# Patient Record
Sex: Female | Born: 1990 | Hispanic: No | Marital: Single | Smoking: Never smoker
Health system: Southern US, Community
[De-identification: ages and names within clinical notes are randomized; demographics above are authoritative.]

## PROBLEM LIST (undated history)

## (undated) DIAGNOSIS — F79 Unspecified intellectual disabilities: Secondary | ICD-10-CM

## (undated) DIAGNOSIS — I10 Essential (primary) hypertension: Secondary | ICD-10-CM

## (undated) DIAGNOSIS — F952 Tourette's disorder: Secondary | ICD-10-CM

## (undated) DIAGNOSIS — F909 Attention-deficit hyperactivity disorder, unspecified type: Secondary | ICD-10-CM

## (undated) HISTORY — DX: Unspecified intellectual disabilities: F79

## (undated) HISTORY — DX: Attention-deficit hyperactivity disorder, unspecified type: F90.9

## (undated) HISTORY — PX: TONSILLECTOMY AND ADENOIDECTOMY: SUR1326

## (undated) HISTORY — DX: Essential (primary) hypertension: I10

## (undated) HISTORY — DX: Tourette's disorder: F95.2

---
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
## 2011-03-27 DIAGNOSIS — F79 Unspecified intellectual disabilities: Secondary | ICD-10-CM | POA: Insufficient documentation

## 2011-03-27 DIAGNOSIS — F902 Attention-deficit hyperactivity disorder, combined type: Secondary | ICD-10-CM | POA: Insufficient documentation

## 2011-03-27 DIAGNOSIS — G9349 Other encephalopathy: Secondary | ICD-10-CM | POA: Insufficient documentation

## 2011-03-27 DIAGNOSIS — F429 Obsessive-compulsive disorder, unspecified: Secondary | ICD-10-CM | POA: Insufficient documentation

## 2011-03-27 DIAGNOSIS — F952 Tourette's disorder: Secondary | ICD-10-CM | POA: Insufficient documentation

## 2014-04-27 ENCOUNTER — Telehealth: Payer: Self-pay | Admitting: Family Medicine

## 2014-04-27 NOTE — Telephone Encounter (Signed)
APPT MADE TO RE ESTABLISH CARE

## 2014-04-29 ENCOUNTER — Ambulatory Visit (INDEPENDENT_AMBULATORY_CARE_PROVIDER_SITE_OTHER): Payer: Medicaid Other | Admitting: Family Medicine

## 2014-04-29 ENCOUNTER — Encounter: Payer: Self-pay | Admitting: Family Medicine

## 2014-04-29 ENCOUNTER — Ambulatory Visit: Payer: Self-pay | Admitting: Family Medicine

## 2014-04-29 ENCOUNTER — Encounter (INDEPENDENT_AMBULATORY_CARE_PROVIDER_SITE_OTHER): Payer: Self-pay

## 2014-04-29 ENCOUNTER — Ambulatory Visit: Payer: Medicaid Other | Admitting: *Deleted

## 2014-04-29 VITALS — BP 130/90 | HR 98 | Temp 99.4°F | Ht 66.0 in | Wt 153.0 lb

## 2014-04-29 DIAGNOSIS — F902 Attention-deficit hyperactivity disorder, combined type: Secondary | ICD-10-CM

## 2014-04-29 DIAGNOSIS — F909 Attention-deficit hyperactivity disorder, unspecified type: Secondary | ICD-10-CM

## 2014-04-29 DIAGNOSIS — F952 Tourette's disorder: Secondary | ICD-10-CM

## 2014-04-29 DIAGNOSIS — F79 Unspecified intellectual disabilities: Secondary | ICD-10-CM

## 2014-04-29 DIAGNOSIS — G9349 Other encephalopathy: Secondary | ICD-10-CM

## 2014-04-29 DIAGNOSIS — Z23 Encounter for immunization: Secondary | ICD-10-CM

## 2014-04-29 DIAGNOSIS — F429 Obsessive-compulsive disorder, unspecified: Secondary | ICD-10-CM

## 2014-04-29 DIAGNOSIS — F42 Obsessive-compulsive disorder: Secondary | ICD-10-CM

## 2014-04-29 DIAGNOSIS — I10 Essential (primary) hypertension: Secondary | ICD-10-CM

## 2014-04-29 LAB — POCT CBC
Granulocyte percent: 52.9 %G (ref 37–80)
HCT, POC: 36.8 % — AB (ref 37.7–47.9)
Hemoglobin: 12.5 g/dL (ref 12.2–16.2)
Lymph, poc: 2.6 (ref 0.6–3.4)
MCH, POC: 30.1 pg (ref 27–31.2)
MCHC: 34 g/dL (ref 31.8–35.4)
MCV: 88.5 fL (ref 80–97)
MPV: 8.6 fL (ref 0–99.8)
POC Granulocyte: 3.1 (ref 2–6.9)
POC LYMPH PERCENT: 44.2 %L (ref 10–50)
Platelet Count, POC: 265 10*3/uL (ref 142–424)
RBC: 4.2 M/uL (ref 4.04–5.48)
RDW, POC: 12.2 %
WBC: 5.8 10*3/uL (ref 4.6–10.2)

## 2014-04-29 MED ORDER — LISINOPRIL 10 MG PO TABS
10.0000 mg | ORAL_TABLET | Freq: Every day | ORAL | Status: DC
Start: 2014-04-29 — End: 2014-10-05

## 2014-04-29 NOTE — Patient Instructions (Signed)
Continue to watch your weight and work on your diet Take your blood pressure pill regularly Watch your sodium intake Drink plenty of water Use earwax softener in both ear canals for 2 or 3 nights in a row weight 1 week and repeat this procedure We will call you with the lab work results once those results are available You will need a BMP in 2-3 weeks and bring home blood pressure readings all returned when the blood work is done

## 2014-04-29 NOTE — Progress Notes (Signed)
Subjective:    Patient ID: Tiffany Bates, female    DOB: 07-09-1990, 23 y.o.   MRN: [id number]  HPI Patient here today to establish care for health maintenance. She is accompanied today by her father and stepmother.The patient is followed by a specialist for her Tourette's syndrome, she will have her come to [place] for blood pressure control and other medical issues that may develop. Her father has had a heart attack. We will most likely want to get some lab work on her and get her started on a blood pressure medication. Readings were brought in for review today from October and early November. These will be scanned into the record.the patient is also on birth control and she has a GYN appointment set up for the first of 2016. Today she is complaining of right ear pain and a   buzzing sensation.         Patient Active Problem List   Diagnosis Date Noted  . Encephalopathy chronic 04/29/2014  . Intellectual disability 04/29/2014  . Tourette syndrome 04/29/2014  . ADHD (attention deficit hyperactivity disorder) 04/29/2014  . Obsessive compulsive disorder 04/29/2014  . HTN (hypertension) 04/29/2014   Outpatient Encounter Prescriptions as of 04/29/2014  Medication Sig  . atomoxetine (STRATTERA) 80 MG capsule Take 80 mg by mouth daily.  . norethindrone-ethinyl estradiol 1/35 (ORTHO-NOVUM, NORTREL,CYCLAFEM) tablet Take 1 tablet by mouth daily.  . Oxcarbazepine (TRILEPTAL) 300 MG tablet Take 900 mg by mouth 2 (two) times daily.  Marland Kitchen PARoxetine (PAXIL) 40 MG tablet Take 40 mg by mouth every morning.    Review of Systems  Constitutional: Negative.   HENT: Negative.   Eyes: Negative.   Respiratory: Negative.   Cardiovascular: Negative.        Elevated BP  Gastrointestinal: Negative.   Endocrine: Negative.   Genitourinary: Negative.   Musculoskeletal: Negative.   Skin: Negative.   Allergic/Immunologic: Negative.   Neurological: Negative.   Hematological: Negative.     Psychiatric/Behavioral: Negative.        Objective:   Physical Exam  Constitutional: She is oriented to person, place, and time. She appears well-developed and well-nourished.  Minimally stressed  HENT:  Head: Normocephalic.  Mouth/Throat: Oropharynx is clear and moist. No oropharyngeal exudate.  Bilateral ear cerumen and nasal congestion  Eyes: Conjunctivae and EOM are normal. Pupils are equal, round, and reactive to light. Right eye exhibits no discharge. Left eye exhibits no discharge. No scleral icterus.  Neck: Normal range of motion. Neck supple. No thyromegaly present.  No anterior cervical adenopathy  Cardiovascular: Normal rate, regular rhythm, normal heart sounds and intact distal pulses.   No murmur heard. Pulmonary/Chest: Effort normal and breath sounds normal. No respiratory distress. She has no wheezes. She has no rales. She exhibits no tenderness.  Abdominal: Soft. Bowel sounds are normal. She exhibits no mass. There is tenderness. There is no rebound and no guarding.  Minimal tenderness left upper quadrant  Musculoskeletal: Normal range of motion. She exhibits no edema.  Lymphadenopathy:    She has no cervical adenopathy.  Neurological: She is alert and oriented to person, place, and time.  Skin: Skin is warm and dry. No rash noted.  Psychiatric: She has a normal mood and affect. Her behavior is normal. Thought content normal.  Nursing note and vitals reviewed.  BP 130/90 mmHg  Pulse 98  Temp(Src) 99.4 F (37.4 C) (Oral)  Ht _0  (1.676 m)  Wt 153 lb (69.4 kg)  BMI 24.71 kg/m2  Repeat blood pressure  132/98 right arm sitting      Assessment & Plan:  1. Encephalopathy chronic - POCT CBC  2. Intellectual disability - POCT CBC  3. Tourette syndrome - POCT CBC  4. Attention deficit hyperactivity disorder (ADHD), combined type - POCT CBC  5. Obsessive compulsive disorder - POCT CBC  6. Essential hypertension - POCT CBC - BMP8+EGFR - Hepatic  function panel - Lipid panel - Thyroid Panel With TSH - lisinopril (PRINIVIL,ZESTRIL) 10 MG tablet; Take 1 tablet (10 mg total) by mouth daily.  Dispense: 90 tablet; Refill: 3  Patient Instructions  Continue to watch your weight and work on your diet Take your blood pressure pill regularly Watch your sodium intake Drink plenty of water Use earwax softener in both ear canals for 2 or 3 nights in a row weight 1 week and repeat this procedure We will call you with the lab work results once those results are available You will need a BMP in 2-3 weeks and bring home blood pressure readings all returned when the blood work is done   Arrie Senate MD

## 2014-04-30 LAB — LIPID PANEL
CHOLESTEROL TOTAL: 202 mg/dL — AB (ref 100–199)
Chol/HDL Ratio: 3.7 ratio units (ref 0.0–4.4)
HDL: 54 mg/dL (ref >39)
LDL Calculated: 129 mg/dL — ABNORMAL HIGH (ref 0–99)
Triglycerides: 93 mg/dL (ref 0–149)
VLDL Cholesterol Cal: 19 mg/dL (ref 5–40)

## 2014-04-30 LAB — BMP8+EGFR
BUN / CREAT RATIO: 20 (ref 8–20)
BUN: 14 mg/dL (ref 6–20)
CO2: 25 mmol/L (ref 18–29)
CREATININE: 0.71 mg/dL (ref 0.57–1.00)
Calcium: 9.6 mg/dL (ref 8.7–10.2)
Chloride: 103 mmol/L (ref 97–108)
GFR calc Af Amer: 139 mL/min/{1.73_m2} (ref >59)
GFR calc non Af Amer: 120 mL/min/{1.73_m2} (ref >59)
Glucose: 92 mg/dL (ref 65–99)
POTASSIUM: 4.5 mmol/L (ref 3.5–5.2)
SODIUM: 143 mmol/L (ref 134–144)

## 2014-04-30 LAB — HEPATIC FUNCTION PANEL
ALT: 15 IU/L (ref 0–32)
AST: 14 IU/L (ref 0–40)
Albumin: 4.3 g/dL (ref 3.5–5.5)
Alkaline Phosphatase: 58 IU/L (ref 39–117)
Bilirubin, Direct: 0.07 mg/dL (ref 0.00–0.40)
Total Protein: 6.6 g/dL (ref 6.0–8.5)

## 2014-04-30 LAB — THYROID PANEL WITH TSH
FREE THYROXINE INDEX: 1.2 (ref 1.2–4.9)
T3 Uptake Ratio: 20 % — ABNORMAL LOW (ref 24–39)
T4 TOTAL: 5.8 ug/dL (ref 4.5–12.0)
TSH: 1.81 u[IU]/mL (ref 0.450–4.500)

## 2014-05-01 ENCOUNTER — Telehealth: Payer: Self-pay | Admitting: Family Medicine

## 2014-05-01 NOTE — Telephone Encounter (Signed)
-----   Message from Donald W Moore, MD sent at 04/30/2014  1:50 PM EST ----- The blood sugar is good at 92. The creatinine, the most important kidney function test is within normal limits. The electrolytes including potassium are within normal limits. All liver function tests are within normal limits The total cholesterol was slightly elevated at 202. The LDL C is elevated at 129. The triglycerides are good.------ please schedule this patient with her mother or father with the clinical pharmacists to discuss therapeutic lifestyle changes which would be helpful in lowering her cholesterol----the parents are out of town for one week. All thyroid function tests are within normal limits 

## 2014-05-07 ENCOUNTER — Telehealth: Payer: Self-pay | Admitting: Family Medicine

## 2014-05-07 NOTE — Telephone Encounter (Signed)
Spoke to pt's mother, she states Tiffany Bates wasn't fasting when she had the blood work drawn, they follow a strict heart healthy low cholesterol diet already and at her visit you had told them to come back in to recheck her labs in 4-6 weeks, she states they will schedule an appointment with Tammy after the results of the follow up labs come back. She doesn't see the point in setting an appointment up with her at this time. Reviewed all other labs with pt's mother, and she voiced understanding. Would you like me to do anything else with this?

## 2014-05-07 NOTE — Telephone Encounter (Signed)
-----   Message from Ernestina Pennaonald W Moore, MD sent at 04/30/2014  1:50 PM EST ----- The blood sugar is good at 92. The creatinine, the most important kidney function test is within normal limits. The electrolytes including potassium are within normal limits. All liver function tests are within normal limits The total cholesterol was slightly elevated at 202. The LDL C is elevated at 129. The triglycerides are good.------ please schedule this patient with her mother or father with the clinical pharmacists to discuss therapeutic lifestyle changes which would be helpful in lowering her cholesterol----the parents are out of town for one week. All thyroid function tests are within normal limits

## 2014-08-03 ENCOUNTER — Other Ambulatory Visit: Payer: Self-pay | Admitting: Family Medicine

## 2014-10-02 ENCOUNTER — Encounter: Payer: Self-pay | Admitting: Family Medicine

## 2014-10-02 ENCOUNTER — Ambulatory Visit (INDEPENDENT_AMBULATORY_CARE_PROVIDER_SITE_OTHER): Payer: Medicaid Other | Admitting: Family Medicine

## 2014-10-02 VITALS — BP 125/84 | HR 93 | Temp 98.4°F | Ht 66.0 in | Wt 151.0 lb

## 2014-10-02 DIAGNOSIS — I10 Essential (primary) hypertension: Secondary | ICD-10-CM

## 2014-10-02 DIAGNOSIS — F952 Tourette's disorder: Secondary | ICD-10-CM

## 2014-10-02 NOTE — Patient Instructions (Addendum)
Continue to monitor blood pressures at home  Continue to watch sodium intake Stay active physically Continue current medication Maintain good posture

## 2014-10-02 NOTE — Progress Notes (Signed)
Subjective:    Patient ID: Tiffany Bates, female    DOB: 04/06/1991, 25 y.o.   MRN: [id number]  HPI  24 year old female comes in today to follow up on hypertension. She is accompanied by her father. She brings in readings with her for review. She is taking her medications regularly. The blood pressure readings are running in the 110 range up to the 120 range over the 70-80 range on the majority of outside blood pressure readings. These will be scanned into the record. The patient is active and she has Tourette syndrome. She walks a lot and exercises. She did describes no chest pain no shortness of breath passing her water okay and her bowels are moving regularly.  Patient Active Problem List   Diagnosis Date Noted  . Encephalopathy chronic 04/29/2014  . Intellectual disability 04/29/2014  . Tourette syndrome 04/29/2014  . ADHD (attention deficit hyperactivity disorder) 04/29/2014  . Obsessive compulsive disorder 04/29/2014  . HTN (hypertension) 04/29/2014   Outpatient Encounter Prescriptions as of 10/02/2014  Medication Sig  . atomoxetine (STRATTERA) 80 MG capsule Take 80 mg by mouth daily.  Marland Kitchen lisinopril (PRINIVIL,ZESTRIL) 10 MG tablet Take 1 tablet (10 mg total) by mouth daily.  . norethindrone-ethinyl estradiol 1/35 (ORTHO-NOVUM, NORTREL,CYCLAFEM) tablet Take 1 tablet by mouth daily.  . Oxcarbazepine (TRILEPTAL) 300 MG tablet Take 900 mg by mouth 2 (two) times daily.  Marland Kitchen PARoxetine (PAXIL) 40 MG tablet Take 40 mg by mouth every morning.       Review of Systems  Constitutional: Negative.   HENT: Negative.   Eyes: Negative.   Respiratory: Negative.   Cardiovascular: Negative.   Gastrointestinal: Negative.   Endocrine: Negative.   Genitourinary: Negative.   Musculoskeletal: Negative.   Skin: Negative.   Allergic/Immunologic: Negative.   Neurological: Negative.   Hematological: Negative.   Psychiatric/Behavioral: Negative.        Objective:   Physical Exam    Constitutional: She is oriented to person, place, and time. She appears well-developed and well-nourished. She appears distressed.  HENT:  Head: Normocephalic and atraumatic.  Eyes: Conjunctivae and EOM are normal. Pupils are equal, round, and reactive to light. Right eye exhibits no discharge. Left eye exhibits no discharge. No scleral icterus.  Neck: Normal range of motion. Neck supple. No thyromegaly present.  Cardiovascular: Normal rate, regular rhythm and intact distal pulses.  Exam reveals no friction rub.   No murmur heard. Pulmonary/Chest: Effort normal and breath sounds normal. No respiratory distress. She has no wheezes. She has no rales. She exhibits no tenderness.  Abdominal: Soft. Bowel sounds are normal. She exhibits no mass. There is no tenderness. There is no rebound and no guarding.  Slight right lower quadrant tenderness  Musculoskeletal: Normal range of motion. She exhibits no edema.  Lymphadenopathy:    She has no cervical adenopathy.  Neurological: She is alert and oriented to person, place, and time.  Skin: Skin is warm and dry. No rash noted.  Psychiatric: She has a normal mood and affect. Her behavior is normal. Judgment and thought content normal.  Somewhat anxious and nervous  Nursing note and vitals reviewed.    BP 125/84 mmHg  Pulse 93  Temp(Src) 98.4 F (36.9 C) (Oral)  Ht _0  (1.676 m)  Wt 151 lb (68.493 kg)  BMI 24.38 kg/m2      Assessment & Plan:  1. Essential hypertension - BMP8+EGFR -Stay active, watch sodium intake  2. Tourette syndrome   Patient Instructions  Continue to monitor  blood pressures at home  Continue to watch sodium intake Stay active physically Continue current medication    Maintain good posture  Arrie Senate MD

## 2014-10-03 LAB — BMP8+EGFR
BUN / CREAT RATIO: 28 — AB (ref 8–20)
BUN: 19 mg/dL (ref 6–20)
CO2: 23 mmol/L (ref 18–29)
Calcium: 9.4 mg/dL (ref 8.7–10.2)
Chloride: 98 mmol/L (ref 97–108)
Creatinine, Ser: 0.67 mg/dL (ref 0.57–1.00)
GFR calc Af Amer: 142 mL/min/{1.73_m2} (ref >59)
GFR calc non Af Amer: 123 mL/min/{1.73_m2} (ref >59)
Glucose: 89 mg/dL (ref 65–99)
Potassium: 4.6 mmol/L (ref 3.5–5.2)
Sodium: 136 mmol/L (ref 134–144)

## 2014-10-05 MED ORDER — LISINOPRIL 10 MG PO TABS: 10.0000 mg | ORAL_TABLET | Freq: Every day | ORAL | Status: DC

## 2014-10-05 NOTE — Addendum Note (Signed)
Addended by: Tamera PuntWRAY, Cleora Karnik S on: 10/05/2014 09:40 AM   Modules accepted: Orders

## 2015-10-11 ENCOUNTER — Other Ambulatory Visit: Payer: Self-pay | Admitting: Family Medicine

## 2015-10-12 ENCOUNTER — Other Ambulatory Visit: Payer: Self-pay | Admitting: Family Medicine

## 2015-10-12 NOTE — Telephone Encounter (Signed)
Last seen 10/02/2014

## 2015-10-12 NOTE — Telephone Encounter (Signed)
This patient needs an appointment to be seen.

## 2015-11-09 ENCOUNTER — Ambulatory Visit: Payer: Medicaid Other | Admitting: Family Medicine

## 2016-01-19 ENCOUNTER — Encounter: Payer: Self-pay | Admitting: Family Medicine

## 2016-01-19 ENCOUNTER — Ambulatory Visit (INDEPENDENT_AMBULATORY_CARE_PROVIDER_SITE_OTHER): Payer: Medicaid Other | Admitting: Family Medicine

## 2016-01-19 VITALS — BP 114/77 | HR 99 | Temp 97.1°F | Ht 66.0 in

## 2016-01-19 DIAGNOSIS — F79 Unspecified intellectual disabilities: Secondary | ICD-10-CM

## 2016-01-19 DIAGNOSIS — R6884 Jaw pain: Secondary | ICD-10-CM | POA: Diagnosis not present

## 2016-01-19 DIAGNOSIS — F429 Obsessive-compulsive disorder, unspecified: Secondary | ICD-10-CM

## 2016-01-19 DIAGNOSIS — G9349 Other encephalopathy: Secondary | ICD-10-CM

## 2016-01-19 DIAGNOSIS — F902 Attention-deficit hyperactivity disorder, combined type: Secondary | ICD-10-CM

## 2016-01-19 DIAGNOSIS — F952 Tourette's disorder: Secondary | ICD-10-CM

## 2016-01-19 DIAGNOSIS — I10 Essential (primary) hypertension: Secondary | ICD-10-CM

## 2016-01-19 MED ORDER — LISINOPRIL 10 MG PO TABS: ORAL_TABLET | ORAL | 1 refills | Status: DC

## 2016-01-19 NOTE — Progress Notes (Signed)
Subjective:    Patient ID: Tiffany Bates, female    DOB: 1991/01/18, 25 y.o.   MRN: [id number]  HPI Pt here for follow up and management of chronic medical problems which includes hypertension. She is accompanied today by her father and a caregiver. She is taking medications regularly.The patient is doing well overall. She does complain of some right jaw pain when eating at times. She is requesting a refill on the lisinopril. We will discuss with her the importance of getting a pelvic exam especially since she is taking birth control pills. The patient denies any chest pain or shortness of breath. She does have occasional abdominal pain and a sore in her abdomen. She is not seeing any blood in the stool or had any problems with swallowing heartburn indigestion nausea or vomiting or diarrhea. She is passing her water without problems.     Patient Active Problem List   Diagnosis Date Noted  . Encephalopathy chronic 04/29/2014  . Intellectual disability 04/29/2014  . Tourette syndrome 04/29/2014  . ADHD (attention deficit hyperactivity disorder) 04/29/2014  . Obsessive compulsive disorder 04/29/2014  . HTN (hypertension) 04/29/2014   Outpatient Encounter Prescriptions as of 01/19/2016  Medication Sig  . atomoxetine (STRATTERA) 80 MG capsule Take 80 mg by mouth daily.  Marland Kitchen lisinopril (PRINIVIL,ZESTRIL) 10 MG tablet Take 1 tablet (10 mg total) by mouth daily.  . methylphenidate 54 MG PO CR tablet TAKE (1) TABLET DAILY IN THE MORNING.  Marland Kitchen norethindrone-ethinyl estradiol 1/35 (ORTHO-NOVUM, NORTREL,CYCLAFEM) tablet Take 1 tablet by mouth daily.  . Oxcarbazepine (TRILEPTAL) 300 MG tablet Take 900 mg by mouth 2 (two) times daily.  Marland Kitchen PARoxetine (PAXIL) 40 MG tablet Take 40 mg by mouth every morning.  . [DISCONTINUED] lisinopril (PRINIVIL,ZESTRIL) 10 MG tablet Take 1 tablet (10 mg total) by mouth daily.   No facility-administered encounter medications on file as of 01/19/2016.       Review of  Systems  Constitutional: Negative.   HENT: Negative.        Right side jaw pain when eating  Eyes: Negative.   Respiratory: Negative.   Cardiovascular: Negative.   Gastrointestinal: Negative.   Endocrine: Negative.   Genitourinary: Negative.   Musculoskeletal: Negative.   Skin: Negative.   Allergic/Immunologic: Negative.   Neurological: Negative.   Hematological: Negative.   Psychiatric/Behavioral: Negative.        Objective:   Physical Exam  Constitutional: She is oriented to person, place, and time. She appears well-developed and well-nourished. She appears distressed.  HENT:  Head: Normocephalic and atraumatic.  Right Ear: External ear normal.  Left Ear: External ear normal.  Nose: Nose normal.  Mouth/Throat: Oropharynx is clear and moist.  The patient was tender in the right TMJ area. This seemed to reproduce some of the pain that she was having in that area.  Eyes: Conjunctivae and EOM are normal. Pupils are equal, round, and reactive to light. Right eye exhibits no discharge. Left eye exhibits no discharge. No scleral icterus.  Neck: Normal range of motion. Neck supple. No thyromegaly present.  No anterior cervical adenopathy or thyromegaly  Cardiovascular: Normal rate, regular rhythm, normal heart sounds and intact distal pulses.   No murmur heard. Pulmonary/Chest: Effort normal and breath sounds normal. No respiratory distress. She has no wheezes. She has no rales. She exhibits no tenderness.  Clear anteriorly and posteriorly  Abdominal: Soft. Bowel sounds are normal. She exhibits no distension and no mass. There is tenderness. There is no rebound and no guarding.  The abdomen was generally tender in the left upper quadrant and right upper quadrant and the lower abdomen all areas but non-specific. There are no masses.  Musculoskeletal: Normal range of motion. She exhibits tenderness. She exhibits no edema.  The right TMJ was tender to palpation.  Lymphadenopathy:     She has no cervical adenopathy.  Neurological: She is alert and oriented to person, place, and time. She has normal reflexes. No cranial nerve deficit.  Skin: Skin is warm and dry. No rash noted.  Psychiatric: Her behavior is normal. Judgment and thought content normal.  The patient was somewhat emotional and describing her jaw pain and her stomach pain and some ankle pain.  Nursing note and vitals reviewed.  BP 114/77 (BP Location: Right Arm)   Pulse 99   Temp 97.1 F (36.2 C) (Oral)   Ht  (1.676 m)         Assessment & Plan:  1. Essential hypertension -The blood pressure is good and she will continue with current treatment -The patient will return to clinic for routine lab work tomorrow  2. Tourette syndrome -Continue current treatment  3. Encephalopathy chronic  4. Attention deficit hyperactivity disorder (ADHD), combined type -Continue current treatment  5. Intellectual disability  6. Obsessive compulsive disorder  7. Jaw pain -Use warm wet compresses 20 minutes 3 or 4 times daily and take ibuprofen 200 mg after meals for 7 days to see if the soreness and tenderness in the right TMJ area can be resolved  Meds ordered this encounter  Medications  . methylphenidate 54 MG PO CR tablet    Sig: TAKE (1) TABLET DAILY IN THE MORNING.  Marland Kitchen lisinopril (PRINIVIL,ZESTRIL) 10 MG tablet    Sig: Take 1 tablet (10 mg total) by mouth daily.    Dispense:  90 tablet    Refill:  1   Patient Instructions  Continue current medications. Continue good therapeutic lifestyle changes which include good diet and exercise. Fall precautions discussed with patient. If an FOBT was given today- please return it to our front desk.  After your visit with [place] today you will receive a survey in the mail or online from [employer] regarding your care with [place]. Please take a moment to fill this out. Your feedback is very important to [place] as you can help [place] better understand your patient needs as  well as improve your experience and satisfaction. WE CARE ABOUT YOU!!!   We will arrange for you to have a visit with one of our physicians who can do an internal exam because of your age She will explain everything to you to make it comfortable with this exam For about 1 week take ibuprofen 200 mg 3 times daily after meals and use warm wet compresses to the right temporomandibular joint area. Continue to watch sodium intake Remember that eating a lot of milk cheese ice cream and dairy products can give you a lot of gas and bloating and can make your abdomen hurt more.     Nyra Capes MD

## 2016-01-19 NOTE — Addendum Note (Signed)
Addended by: Magdalene River on: 01/19/2016 04:28 PM   Modules accepted: Orders

## 2016-01-19 NOTE — Patient Instructions (Addendum)
Continue current medications. Continue good therapeutic lifestyle changes which include good diet and exercise. Fall precautions discussed with patient. If an FOBT was given today- please return it to our front desk.  After your visit with [place] today you will receive a survey in the mail or online from [employer] regarding your care with [place]. Please take a moment to fill this out. Your feedback is very important to [place] as you can help [place] better understand your patient needs as well as improve your experience and satisfaction. WE CARE ABOUT YOU!!!   We will arrange for you to have a visit with one of our physicians who can do an internal exam because of your age She will explain everything to you to make it comfortable with this exam For about 1 week take ibuprofen 200 mg 3 times daily after meals and use warm wet compresses to the right temporomandibular joint area. Continue to watch sodium intake Remember that eating a lot of milk cheese ice cream and dairy products can give you a lot of gas and bloating and can make your abdomen hurt more.

## 2016-01-20 ENCOUNTER — Other Ambulatory Visit: Payer: Medicaid Other

## 2016-01-20 DIAGNOSIS — I10 Essential (primary) hypertension: Secondary | ICD-10-CM

## 2016-01-21 LAB — HEPATIC FUNCTION PANEL
ALBUMIN: 4 g/dL (ref 3.5–5.5)
ALK PHOS: 60 IU/L (ref 39–117)
ALT: 19 IU/L (ref 0–32)
AST: 18 IU/L (ref 0–40)
BILIRUBIN, DIRECT: 0.05 mg/dL (ref 0.00–0.40)
Total Protein: 6.7 g/dL (ref 6.0–8.5)

## 2016-01-21 LAB — BMP8+EGFR
BUN/Creatinine Ratio: 31 — ABNORMAL HIGH (ref 9–23)
BUN: 21 mg/dL — AB (ref 6–20)
CALCIUM: 9.3 mg/dL (ref 8.7–10.2)
CO2: 19 mmol/L (ref 18–29)
CREATININE: 0.68 mg/dL (ref 0.57–1.00)
Chloride: 104 mmol/L (ref 96–106)
GFR, EST AFRICAN AMERICAN: 141 mL/min/{1.73_m2} (ref >59)
GFR, EST NON AFRICAN AMERICAN: 122 mL/min/{1.73_m2} (ref >59)
Glucose: 95 mg/dL (ref 65–99)
POTASSIUM: 4.6 mmol/L (ref 3.5–5.2)
Sodium: 139 mmol/L (ref 134–144)

## 2016-01-21 LAB — LIPID PANEL
CHOL/HDL RATIO: 2.7 ratio (ref 0.0–4.4)
Cholesterol, Total: 157 mg/dL (ref 100–199)
HDL: 58 mg/dL (ref >39)
LDL Calculated: 86 mg/dL (ref 0–99)
Triglycerides: 67 mg/dL (ref 0–149)
VLDL Cholesterol Cal: 13 mg/dL (ref 5–40)

## 2016-01-25 NOTE — Progress Notes (Signed)
Patient mother aware. 

## 2016-04-19 ENCOUNTER — Other Ambulatory Visit: Payer: Self-pay | Admitting: Family Medicine

## 2016-06-29 DIAGNOSIS — F411 Generalized anxiety disorder: Secondary | ICD-10-CM | POA: Insufficient documentation

## 2016-07-13 ENCOUNTER — Telehealth: Payer: Self-pay | Admitting: Family Medicine

## 2016-07-13 MED ORDER — LISINOPRIL 10 MG PO TABS: 10.0000 mg | ORAL_TABLET | Freq: Every day | ORAL | 0 refills | Status: DC

## 2016-07-13 NOTE — Telephone Encounter (Signed)
done

## 2016-07-19 ENCOUNTER — Ambulatory Visit: Payer: Medicaid Other | Admitting: Family Medicine

## 2016-09-12 ENCOUNTER — Encounter: Payer: Self-pay | Admitting: Family Medicine

## 2016-09-12 ENCOUNTER — Ambulatory Visit (INDEPENDENT_AMBULATORY_CARE_PROVIDER_SITE_OTHER): Payer: Medicaid Other

## 2016-09-12 ENCOUNTER — Ambulatory Visit (INDEPENDENT_AMBULATORY_CARE_PROVIDER_SITE_OTHER): Payer: Medicaid Other | Admitting: Family Medicine

## 2016-09-12 VITALS — BP 133/87 | HR 96 | Temp 98.0°F | Ht 66.0 in | Wt 152.0 lb

## 2016-09-12 DIAGNOSIS — I10 Essential (primary) hypertension: Secondary | ICD-10-CM

## 2016-09-12 DIAGNOSIS — F952 Tourette's disorder: Secondary | ICD-10-CM

## 2016-09-12 DIAGNOSIS — F902 Attention-deficit hyperactivity disorder, combined type: Secondary | ICD-10-CM | POA: Diagnosis not present

## 2016-09-12 DIAGNOSIS — R0781 Pleurodynia: Secondary | ICD-10-CM | POA: Diagnosis not present

## 2016-09-12 DIAGNOSIS — E559 Vitamin D deficiency, unspecified: Secondary | ICD-10-CM | POA: Diagnosis not present

## 2016-09-12 NOTE — Addendum Note (Signed)
Addended by: Magdalene RiverBULLINS, JAMIE H on: 09/12/2016 04:05 PM   Modules accepted: Orders

## 2016-09-12 NOTE — Progress Notes (Signed)
Subjective:    Patient ID: Tiffany Bates, female    DOB: May 21, 1991, 26 y.o.   MRN: [id number]  HPI Pt here for follow up and management of chronic medical problems which includes hypertension. She is taking medication regularly.The patient comes with her dad to the visit today. She is due to get lab work and to return an FOBT. She will schedule a Pap smear with one of our providers. She's never had a chest x-ray and we will get one today as a baseline. She is having some right rib pain at times. This is worse with laying down. This patient has a history of hypertension and attention deficit disorder with obsessive-compulsive behavior. The patient denies any chest pain (anterior rib type pain on the right side next to the right upper quadrant of the abdomen. She denies any injury. She says it hurts worse at nighttime. She says it does not hurt when she takes a deep breath. She otherwise denies any chest pain or shortness of breath. She denies any trouble with her stomach nausea vomiting diarrhea blood in the stool or black tarry bowel movements and she is passing her water without problems.    Patient Active Problem List   Diagnosis Date Noted  . Encephalopathy chronic 04/29/2014  . Intellectual disability 04/29/2014  . ADHD (attention deficit hyperactivity disorder) 04/29/2014  . Obsessive compulsive disorder 04/29/2014  . HTN (hypertension) 04/29/2014   Outpatient Encounter Prescriptions as of 09/12/2016  Medication Sig  . atomoxetine (STRATTERA) 80 MG capsule Take 80 mg by mouth daily.  Marland Kitchen lisinopril (PRINIVIL,ZESTRIL) 10 MG tablet Take 1 tablet (10 mg total) by mouth daily.  . methylphenidate 54 MG PO CR tablet TAKE (1) TABLET DAILY IN THE MORNING.  Marland Kitchen norethindrone-ethinyl estradiol 1/35 (ORTHO-NOVUM, NORTREL,CYCLAFEM) tablet Take 1 tablet by mouth daily.  . Oxcarbazepine (TRILEPTAL) 300 MG tablet Take 900 mg by mouth 2 (two) times daily.  Marland Kitchen PARoxetine (PAXIL) 40 MG tablet Take 40 mg by  mouth every morning.   No facility-administered encounter medications on file as of 09/12/2016.       Review of Systems  Constitutional: Negative.   HENT: Negative.   Eyes: Negative.   Respiratory: Negative.   Cardiovascular: Negative.   Gastrointestinal: Negative.   Endocrine: Negative.   Genitourinary: Negative.   Musculoskeletal: Positive for arthralgias (rib pain right side at times).  Skin: Negative.   Allergic/Immunologic: Negative.   Neurological: Negative.   Hematological: Negative.   Psychiatric/Behavioral: Negative.        Objective:   Physical Exam  Constitutional: She is oriented to person, place, and time. She appears well-developed and well-nourished. She appears distressed.  Patient was somewhat tearful in describing her pain that she has been sleeping in the bed at night time. She was anxious. She seemed to calm down after the blood was drawn for her lab work.  HENT:  Head: Normocephalic and atraumatic.  Left Ear: External ear normal.  Nose: Nose normal.  Mouth/Throat: Oropharynx is clear and moist.  Cerumen right ear canal  Eyes: Conjunctivae and EOM are normal. Pupils are equal, round, and reactive to light. Right eye exhibits no discharge. Left eye exhibits no discharge. No scleral icterus.  Neck: Normal range of motion. Neck supple. No thyromegaly present.  Cardiovascular: Normal rate, regular rhythm, normal heart sounds and intact distal pulses.   No murmur heard. The heart was regular at 84/m  Pulmonary/Chest: Effort normal and breath sounds normal. No respiratory distress. She has no wheezes. She has  no rales. She exhibits tenderness.  Lungs were clear anteriorly and posteriorly. There was some right lateral chest wall tenderness.  Abdominal: Soft. Bowel sounds are normal. She exhibits no mass. There is tenderness. There is no rebound and no guarding.  The left upper quadrant of the abdomen was slightly tender.  Musculoskeletal: Normal range of motion.  She exhibits no edema or tenderness.  Lymphadenopathy:    She has no cervical adenopathy.  Neurological: She is alert and oriented to person, place, and time. She has normal reflexes. No cranial nerve deficit.  Skin: Skin is warm and dry. No rash noted.  Psychiatric: She has a normal mood and affect. Her behavior is normal. Judgment and thought content normal.  Nursing note and vitals reviewed.  BP 133/87 (BP Location: Right Arm)   Pulse 96   Temp 98 F (36.7 C) (Oral)   Ht '5\' 6"'  (1.676 m)   Wt 152 lb (68.9 kg)   BMI 24.53 kg/m    Chest x-ray with results pending===     Assessment & Plan:  1. Essential hypertension -Blood pressure is good today the patient should continue with current treatment - BMP8+EGFR - CBC with Differential/Platelet - Hepatic function panel - Lipid panel  2. Tourette syndrome -Continue with current medications. - CBC with Differential/Platelet  3. Attention deficit hyperactivity disorder (ADHD), combined type -Continue with current medications. - CBC with Differential/Platelet  4. Vitamin D deficiency -Continue vitamin D replacement pending results of lab work - VITAMIN D 25 Hydroxy (Vit-D Deficiency, Fractures)  5. Rib pain on right side -Get chest x-ray -Use warm wet compresses to right anterior rib area -Take Tylenol if needed for pain  Patient Instructions  Continue current medications. Continue good therapeutic lifestyle changes which include good diet and exercise. Fall precautions discussed with patient. If an FOBT was given today- please return it to our front desk.  **Flu shots are available--- please call and schedule a FLU-CLINIC appointment**  After your visit with [place] today you will receive a survey in the mail or online from [employer] regarding your care with [place]. Please take a moment to fill this out. Your feedback is very important to [place] as you can help [place] better understand your patient needs as well as improve your  experience and satisfaction. WE CARE ABOUT YOU!!!   Use warm wet compresses to the anterior right rib area 20 minutes 2 - 3 times daily Take extra strength Tylenol if needed for pain Continue to take blood pressure medicine as doing We will call with lab work once it becomes available Use Debrox 2-3 drops in the right ear canal for 2 or 3 nights in a row and wait 1 week and repeat this to help soften earwax.  Arrie Senate MD

## 2016-09-12 NOTE — Patient Instructions (Addendum)
Continue current medications. Continue good therapeutic lifestyle changes which include good diet and exercise. Fall precautions discussed with patient. If an FOBT was given today- please return it to our front desk.  **Flu shots are available--- please call and schedule a FLU-CLINIC appointment**  After your visit with us today you will receive a survey in the mail or online from [employer] regarding your care with us. Please take a moment to fill this out. Your feedback is very important to us as you can help us better understand your patient needs as well as improve your experience and satisfaction. WE CARE ABOUT YOU!!!   Use warm wet compresses to the anterior right rib area 20 minutes 2 - 3 times daily Take extra strength Tylenol if needed for pain Continue to take blood pressure medicine as doing We will call with lab work once it becomes available Use Debrox 2-3 drops in the right ear canal for 2 or 3 nights in a row and wait 1 week and repeat this to help soften earwax.

## 2016-09-13 LAB — CBC WITH DIFFERENTIAL/PLATELET
BASOS: 0 %
Basophils Absolute: 0 10*3/uL (ref 0.0–0.2)
EOS (ABSOLUTE): 0 10*3/uL (ref 0.0–0.4)
EOS: 1 %
HEMATOCRIT: 34.6 % (ref 34.0–46.6)
HEMOGLOBIN: 11.9 g/dL (ref 11.1–15.9)
IMMATURE GRANULOCYTES: 0 %
Immature Grans (Abs): 0 10*3/uL (ref 0.0–0.1)
Lymphocytes Absolute: 2.4 10*3/uL (ref 0.7–3.1)
Lymphs: 41 %
MCH: 30.3 pg (ref 26.6–33.0)
MCHC: 34.4 g/dL (ref 31.5–35.7)
MCV: 88 fL (ref 79–97)
MONOS ABS: 0.4 10*3/uL (ref 0.1–0.9)
Monocytes: 7 %
NEUTROS PCT: 51 %
Neutrophils Absolute: 3 10*3/uL (ref 1.4–7.0)
Platelets: 282 10*3/uL (ref 150–379)
RBC: 3.93 x10E6/uL (ref 3.77–5.28)
RDW: 13 % (ref 12.3–15.4)
WBC: 5.9 10*3/uL (ref 3.4–10.8)

## 2016-09-13 LAB — BMP8+EGFR
BUN / CREAT RATIO: 30 — AB (ref 9–23)
BUN: 19 mg/dL (ref 6–20)
CALCIUM: 9.1 mg/dL (ref 8.7–10.2)
CO2: 24 mmol/L (ref 18–29)
CREATININE: 0.63 mg/dL (ref 0.57–1.00)
Chloride: 104 mmol/L (ref 96–106)
GFR calc Af Amer: 143 mL/min/{1.73_m2} (ref >59)
GFR, EST NON AFRICAN AMERICAN: 124 mL/min/{1.73_m2} (ref >59)
GLUCOSE: 86 mg/dL (ref 65–99)
Potassium: 4.6 mmol/L (ref 3.5–5.2)
SODIUM: 142 mmol/L (ref 134–144)

## 2016-09-13 LAB — LIPID PANEL
CHOL/HDL RATIO: 3.1 ratio (ref 0.0–4.4)
Cholesterol, Total: 168 mg/dL (ref 100–199)
HDL: 55 mg/dL (ref >39)
LDL Calculated: 94 mg/dL (ref 0–99)
Triglycerides: 93 mg/dL (ref 0–149)
VLDL Cholesterol Cal: 19 mg/dL (ref 5–40)

## 2016-09-13 LAB — HEPATIC FUNCTION PANEL
ALK PHOS: 45 IU/L (ref 39–117)
ALT: 14 IU/L (ref 0–32)
AST: 12 IU/L (ref 0–40)
Albumin: 4 g/dL (ref 3.5–5.5)
BILIRUBIN, DIRECT: 0.04 mg/dL (ref 0.00–0.40)
Total Protein: 6.4 g/dL (ref 6.0–8.5)

## 2016-09-13 LAB — VITAMIN D 25 HYDROXY (VIT D DEFICIENCY, FRACTURES): Vit D, 25-Hydroxy: 16.8 ng/mL — ABNORMAL LOW (ref 30.0–100.0)

## 2017-01-08 ENCOUNTER — Other Ambulatory Visit: Payer: Self-pay | Admitting: Family Medicine

## 2017-03-14 ENCOUNTER — Encounter: Payer: Self-pay | Admitting: Family Medicine

## 2017-03-14 ENCOUNTER — Ambulatory Visit (INDEPENDENT_AMBULATORY_CARE_PROVIDER_SITE_OTHER): Payer: Medicaid Other | Admitting: Family Medicine

## 2017-03-14 VITALS — BP 120/78 | HR 104 | Temp 98.6°F | Ht 66.0 in | Wt 156.0 lb

## 2017-03-14 DIAGNOSIS — H6121 Impacted cerumen, right ear: Secondary | ICD-10-CM

## 2017-03-14 DIAGNOSIS — Z23 Encounter for immunization: Secondary | ICD-10-CM | POA: Diagnosis not present

## 2017-03-14 DIAGNOSIS — F952 Tourette's disorder: Secondary | ICD-10-CM | POA: Diagnosis not present

## 2017-03-14 DIAGNOSIS — I10 Essential (primary) hypertension: Secondary | ICD-10-CM

## 2017-03-14 DIAGNOSIS — E559 Vitamin D deficiency, unspecified: Secondary | ICD-10-CM

## 2017-03-14 DIAGNOSIS — F902 Attention-deficit hyperactivity disorder, combined type: Secondary | ICD-10-CM

## 2017-03-14 NOTE — Progress Notes (Addendum)
Subjective:    Patient ID: Tiffany Bates, female    DOB: 1991-05-14, 26 y.o.   MRN: [id number]  HPI Pt here for follow up and management of chronic medical problems which includes hypertension and Tourette Syndrome. She is taking medication regularly.The patient comes with her caregiver to the visit today and has no specific complaints. The family would prefer no blood work to be drawn but would like her to get her flu shot today. Vital signs were stable and blood pressure was good. She is taking lisinopril Paxil, Trileptal, methylphenidate and Strattera. The patient and the caregiver deny any particular medical problems. She does question her vision not being as good as she thinks it should be and the caregiver will correlate this to the parents to see about having her vision checked. She denies chest pain shortness of breath intestinal issues or trouble passing her water.     Patient Active Problem List   Diagnosis Date Noted  . Encephalopathy chronic 04/29/2014  . Intellectual disability 04/29/2014  . ADHD (attention deficit hyperactivity disorder) 04/29/2014  . Obsessive compulsive disorder 04/29/2014  . HTN (hypertension) 04/29/2014   Outpatient Encounter Prescriptions as of 03/14/2017  Medication Sig  . atomoxetine (STRATTERA) 80 MG capsule Take 80 mg by mouth daily.  Marland Kitchen lisinopril (PRINIVIL,ZESTRIL) 10 MG tablet TAKE 1 TABLET DAILY  . methylphenidate 54 MG PO CR tablet TAKE (1) TABLET DAILY IN THE MORNING.  Marland Kitchen norethindrone-ethinyl estradiol 1/35 (ORTHO-NOVUM, NORTREL,CYCLAFEM) tablet Take 1 tablet by mouth daily.  . Oxcarbazepine (TRILEPTAL) 300 MG tablet Take 900 mg by mouth 2 (two) times daily.  Marland Kitchen PARoxetine (PAXIL) 40 MG tablet Take 40 mg by mouth every morning.  . [DISCONTINUED] lisinopril (PRINIVIL,ZESTRIL) 10 MG tablet Take 1 tablet (10 mg total) by mouth daily.   No facility-administered encounter medications on file as of 03/14/2017.      Review of Systems    Constitutional: Negative.   HENT: Negative.   Eyes: Negative.   Respiratory: Negative.   Cardiovascular: Negative.   Gastrointestinal: Negative.   Endocrine: Negative.   Genitourinary: Negative.   Musculoskeletal: Negative.   Skin: Negative.   Allergic/Immunologic: Negative.   Neurological: Negative.   Hematological: Negative.   Psychiatric/Behavioral: Negative.        Objective:   Physical Exam  Constitutional: She is oriented to person, place, and time. She appears well-developed and well-nourished. She appears distressed.  The patient is somewhat anxious and nervous.  HENT:  Head: Normocephalic and atraumatic.  Left Ear: External ear normal.  Nose: Nose normal.  Mouth/Throat: Oropharynx is clear and moist.  Ear cerumen right ear canal  Eyes: Pupils are equal, round, and reactive to light. Conjunctivae and EOM are normal. Right eye exhibits no discharge. Left eye exhibits no discharge. No scleral icterus.  Neck: Normal range of motion. Neck supple. No thyromegaly present.  Cardiovascular: Normal rate, regular rhythm, normal heart sounds and intact distal pulses.   No murmur heard. Pulmonary/Chest: Effort normal and breath sounds normal. No respiratory distress. She has no wheezes. She has no rales.  Clear anteriorly and posteriorly  Abdominal: Soft. Bowel sounds are normal. She exhibits no mass. There is no tenderness. There is no rebound and no guarding.  Normal exam  Musculoskeletal: Normal range of motion. She exhibits no edema.  Lymphadenopathy:    She has no cervical adenopathy.  Neurological: She is alert and oriented to person, place, and time. She has normal reflexes. No cranial nerve deficit.  Skin: Skin is warm and  dry. No rash noted.  Skin is somewhat warm and clammy  Psychiatric: She has a normal mood and affect. Her behavior is normal. Judgment and thought content normal.  Nursing note and vitals reviewed.   BP 120/78 (BP Location: Right Arm)   Pulse (!)  104   Temp 98.6 F (37 C) (Oral)   Ht  (1.676 m)   Wt 156 lb (70.8 kg)   BMI 25.18 kg/m        Assessment & Plan:  1. Essential hypertension -Pressure is good and she will continue with current treatment  2. Tourette syndrome -Continue with current treatment  3. Attention deficit hyperactivity disorder (ADHD), combined type -Continue with current treatment  4. Vitamin D deficiency -Continue with vitamin D replacement  5. Impacted cerumen, right ear -Debrox eardrops to soften wax for more easy removal  Patient Instructions   Continue current medications. Continue good therapeutic lifestyle changes which include good diet and exercise. Fall precautions discussed with patient. If an FOBT was given today- please return it to our front desk. If you are over 14 years old - you may need Prevnar 13 or the adult Pneumonia vaccine.  **Flu shots are available--- please call and schedule a FLU-CLINIC appointment**  After your visit with [place] today you will receive a survey in the mail or online from [employer] regarding your care with [place]. Please take a moment to fill this out. Your feedback is very important to [place] as you can help [place] better understand your patient needs as well as improve your experience and satisfaction. WE CARE ABOUT YOU!!!   Use Debrox 3-4 drops to the affected ear for 3 days in a row and if impacted cerumen does not drain out bring the patient back to the office for ear irrigation    Nyra Capes MD

## 2017-03-14 NOTE — Patient Instructions (Addendum)
  Continue current medications. Continue good therapeutic lifestyle changes which include good diet and exercise. Fall precautions discussed with patient. If an FOBT was given today- please return it to our front desk. If you are over 27 years old - you may need Prevnar 13 or the adult Pneumonia vaccine.  **Flu shots are available--- please call and schedule a FLU-CLINIC appointment**  After your visit with [place] today you will receive a survey in the mail or online from [employer] regarding your care with [place]. Please take a moment to fill this out. Your feedback is very important to [place] as you can help [place] better understand your patient needs as well as improve your experience and satisfaction. WE CARE ABOUT YOU!!!   Use Debrox 3-4 drops to the affected ear for 3 days in a row and if impacted cerumen does not drain out bring the patient back to the office for ear irrigation

## 2017-04-06 ENCOUNTER — Other Ambulatory Visit: Payer: Self-pay | Admitting: Family Medicine

## 2017-09-11 ENCOUNTER — Ambulatory Visit: Payer: Medicaid Other | Admitting: Family Medicine

## 2017-09-17 ENCOUNTER — Other Ambulatory Visit: Payer: Self-pay | Admitting: Family Medicine

## 2017-12-24 ENCOUNTER — Other Ambulatory Visit: Payer: Self-pay | Admitting: Family Medicine

## 2017-12-24 NOTE — Telephone Encounter (Signed)
Please have her schedule a f/u w/ Dr Christell ConstantMoore.  I have sent in 1 month fill of medication.

## 2018-01-14 ENCOUNTER — Other Ambulatory Visit: Payer: Self-pay | Admitting: Family Medicine

## 2018-01-15 NOTE — Telephone Encounter (Signed)
OV 02/13/18 

## 2018-02-12 ENCOUNTER — Other Ambulatory Visit: Payer: Self-pay | Admitting: Family Medicine

## 2018-02-13 ENCOUNTER — Encounter: Payer: Self-pay | Admitting: Family Medicine

## 2018-02-13 ENCOUNTER — Ambulatory Visit (INDEPENDENT_AMBULATORY_CARE_PROVIDER_SITE_OTHER): Payer: Medicaid Other | Admitting: Family Medicine

## 2018-02-13 VITALS — BP 117/80 | HR 108 | Temp 99.2°F | Ht 66.0 in | Wt 167.0 lb

## 2018-02-13 DIAGNOSIS — I10 Essential (primary) hypertension: Secondary | ICD-10-CM | POA: Diagnosis not present

## 2018-02-13 DIAGNOSIS — E559 Vitamin D deficiency, unspecified: Secondary | ICD-10-CM | POA: Diagnosis not present

## 2018-02-13 DIAGNOSIS — F952 Tourette's disorder: Secondary | ICD-10-CM | POA: Diagnosis not present

## 2018-02-13 DIAGNOSIS — F902 Attention-deficit hyperactivity disorder, combined type: Secondary | ICD-10-CM | POA: Diagnosis not present

## 2018-02-13 DIAGNOSIS — G9349 Other encephalopathy: Secondary | ICD-10-CM

## 2018-02-13 DIAGNOSIS — J301 Allergic rhinitis due to pollen: Secondary | ICD-10-CM

## 2018-02-13 MED ORDER — FLUTICASONE PROPIONATE 50 MCG/ACT NA SUSP: 2.0000 | Freq: Every day | NASAL | 6 refills | Status: DC

## 2018-02-13 NOTE — Patient Instructions (Addendum)
Continue current medications. Continue good therapeutic lifestyle changes which include good diet and exercise. Fall precautions discussed with patient. If an FOBT was given today- please return it to our front desk.  **Flu shots are available--- please call and schedule a FLU-CLINIC appointment**  After your visit with us today you will receive a survey in the mail or online from [employer] regarding your care with us. Please take a moment to fill this out. Your feedback is very important to us as you can help us better understand your patient needs as well as improve your experience and satisfaction. WE CARE ABOUT YOU!!!   We will start Flonase 1 to 2 sprays each nostril at bedtime The patient was instructed to drink more fluids and stay well-hydrated She should stay on her blood pressure medicine as she is currently doing We will call the parents with the lab work results as soon as these results become available

## 2018-02-13 NOTE — Progress Notes (Signed)
Subjective:    Patient ID: Tiffany Bates, female    DOB: [birth year], 27 y.o.   MRN: [id number]  HPI Pt here for follow up and management of chronic medical problems which includes hypertension. She is taking medication regularly.  Patient is doing well overall.  She has no specific complaints.  She has obsessive-compulsive disorder and attention deficit disorder.  She is on lisinopril for her blood pressure and takes methylphenidate and Strattera for her psychological issues.  She is also on a birth control pill and we need to make sure she is getting her pelvic exams regularly.  She takes Paxil 40 mg.  Patient is pleasant and overall doing well.  She comes with her caregiver who says that no problems are going on at home.  The patient does complain of some headaches for the past couple days but the caregiver says it was better today.  She denies any chest pain shortness of breath problems with her intestinal tract including nausea vomiting diarrhea blood in the stool or black tarry bowel movements or change in bowel habits.  She denies any trouble passing her water.  The caregiver confirms all of this.    Patient Active Problem List   Diagnosis Date Noted  . Encephalopathy chronic 04/29/2014  . Intellectual disability 04/29/2014  . ADHD (attention deficit hyperactivity disorder) 04/29/2014  . Obsessive compulsive disorder 04/29/2014  . HTN (hypertension) 04/29/2014   Outpatient Encounter Medications as of 02/13/2018  Medication Sig  . atomoxetine (STRATTERA) 80 MG capsule Take 80 mg by mouth daily.  Marland Kitchen lisinopril (PRINIVIL,ZESTRIL) 10 MG tablet TAKE 1 TABLET DAILY  . methylphenidate 54 MG PO CR tablet TAKE (1) TABLET DAILY IN THE MORNING.  Marland Kitchen norethindrone-ethinyl estradiol 1/35 (ORTHO-NOVUM, NORTREL,CYCLAFEM) tablet Take 1 tablet by mouth daily.  . Oxcarbazepine (TRILEPTAL) 300 MG tablet Take 900 mg by mouth 2 (two) times daily.  Marland Kitchen PARoxetine (PAXIL) 40 MG tablet Take 40 mg by mouth every  morning.   No facility-administered encounter medications on file as of 02/13/2018.       Review of Systems  Constitutional: Negative.   HENT: Negative.   Eyes: Negative.   Respiratory: Negative.   Cardiovascular: Negative.   Gastrointestinal: Negative.   Endocrine: Negative.   Genitourinary: Negative.   Musculoskeletal: Negative.   Skin: Negative.   Allergic/Immunologic: Negative.   Neurological: Negative.   Hematological: Negative.   Psychiatric/Behavioral: Negative.        Objective:   Physical Exam  Constitutional: She is oriented to person, place, and time. She appears well-developed and well-nourished. No distress.  The patient is pleasant and alert but somewhat anxious and nervous about being in the doctor's office.  HENT:  Head: Normocephalic and atraumatic.  Right Ear: External ear normal.  Left Ear: External ear normal.  Mouth/Throat: Oropharyngeal exudate present.  There is nasal turbinate congestion bilaterally with no purulent drainage.  Eyes: Pupils are equal, round, and reactive to light. Conjunctivae and EOM are normal. Right eye exhibits no discharge. Left eye exhibits no discharge. No scleral icterus.  Neck: Normal range of motion. Neck supple. No thyromegaly present.  Anterior cervical adenopathy.  No thyromegaly.  Cardiovascular: Normal rate, regular rhythm, normal heart sounds and intact distal pulses.  No murmur heard. The heart was regular at 96/min  Pulmonary/Chest: Effort normal and breath sounds normal. She has no wheezes. She has no rales.  Clear anteriorly and posteriorly  Abdominal: Soft. Bowel sounds are normal. She exhibits no mass. There is no tenderness.  The abdomen was soft without masses organ enlargement or bruits or suprapubic tenderness.  Musculoskeletal: Normal range of motion. She exhibits no edema.  Spasticity in general is minimal.  Lymphadenopathy:    She has no cervical adenopathy.  Neurological: She is alert and oriented to  person, place, and time. She has normal reflexes. No cranial nerve deficit.  Skin: Skin is warm and dry. No rash noted.  Psychiatric: She has a normal mood and affect. Her behavior is normal. Judgment and thought content normal.  Mood affect and behavior for this patient appeared to be her normal.  Nursing note and vitals reviewed.   BP 117/80 (BP Location: Left Arm)   Pulse (!) 108   Temp 99.2 F (37.3 C) (Oral)   Ht '5\' 6"'  (1.676 m)   Wt 167 lb (75.8 kg)   BMI 26.95 kg/m        Assessment & Plan:  1. Essential hypertension -The blood pressure is good today she will continue with current treatment - BMP8+EGFR - CBC with Differential/Platelet - Lipid panel - Hepatic function panel  2. Attention deficit hyperactivity disorder (ADHD), combined type -Continue with current treatment - CBC with Differential/Platelet  3. Tourette syndrome -Continue with current treatment - CBC with Differential/Platelet  4. Vitamin D deficiency -Continue with vitamin D replacement pending results of lab work - CBC with Differential/Platelet - VITAMIN D 25 Hydroxy (Vit-D Deficiency, Fractures)  5. Encephalopathy chronic -Continue with current medications  6. Seasonal allergic rhinitis due to pollen -Flonase 1 to 2 sprays each nostril daily and drink more water   No orders of the defined types were placed in this encounter.  Patient Instructions  Continue current medications. Continue good therapeutic lifestyle changes which include good diet and exercise. Fall precautions discussed with patient. If an FOBT was given today- please return it to our front desk.  **Flu shots are available--- please call and schedule a FLU-CLINIC appointment**  After your visit with [place] today you will receive a survey in the mail or online from [employer] regarding your care with [place]. Please take a moment to fill this out. Your feedback is very important to [place] as you can help [place] better understand your  patient needs as well as improve your experience and satisfaction. WE CARE ABOUT YOU!!!   We will start Flonase 1 to 2 sprays each nostril at bedtime The patient was instructed to drink more fluids and stay well-hydrated She should stay on her blood pressure medicine as she is currently doing We will call the parents with the lab work results as soon as these results become available  Arrie Senate MD

## 2018-02-13 NOTE — Addendum Note (Signed)
Addended by: Magdalene RiverBULLINS, Bohdi Leeds H on: 02/13/2018 11:36 AM   Modules accepted: Orders

## 2018-02-14 LAB — BMP8+EGFR
BUN / CREAT RATIO: 24 — AB (ref 9–23)
BUN: 16 mg/dL (ref 6–20)
CO2: 23 mmol/L (ref 20–29)
Calcium: 9.7 mg/dL (ref 8.7–10.2)
Chloride: 100 mmol/L (ref 96–106)
Creatinine, Ser: 0.67 mg/dL (ref 0.57–1.00)
GFR, EST AFRICAN AMERICAN: 139 mL/min/{1.73_m2} (ref >59)
GFR, EST NON AFRICAN AMERICAN: 121 mL/min/{1.73_m2} (ref >59)
Glucose: 89 mg/dL (ref 65–99)
Potassium: 4.6 mmol/L (ref 3.5–5.2)
SODIUM: 136 mmol/L (ref 134–144)

## 2018-02-14 LAB — CBC WITH DIFFERENTIAL/PLATELET
BASOS ABS: 0 10*3/uL (ref 0.0–0.2)
Basos: 0 %
EOS (ABSOLUTE): 0 10*3/uL (ref 0.0–0.4)
Eos: 1 %
HEMOGLOBIN: 13 g/dL (ref 11.1–15.9)
Hematocrit: 38.3 % (ref 34.0–46.6)
IMMATURE GRANS (ABS): 0 10*3/uL (ref 0.0–0.1)
IMMATURE GRANULOCYTES: 0 %
LYMPHS ABS: 2.3 10*3/uL (ref 0.7–3.1)
LYMPHS: 37 %
MCH: 29.7 pg (ref 26.6–33.0)
MCHC: 33.9 g/dL (ref 31.5–35.7)
MCV: 88 fL (ref 79–97)
MONOCYTES: 7 %
Monocytes Absolute: 0.4 10*3/uL (ref 0.1–0.9)
Neutrophils Absolute: 3.4 10*3/uL (ref 1.4–7.0)
Neutrophils: 55 %
Platelets: 330 10*3/uL (ref 150–450)
RBC: 4.37 x10E6/uL (ref 3.77–5.28)
RDW: 12.1 % — ABNORMAL LOW (ref 12.3–15.4)
WBC: 6.2 10*3/uL (ref 3.4–10.8)

## 2018-02-14 LAB — LIPID PANEL
CHOL/HDL RATIO: 3.2 ratio (ref 0.0–4.4)
CHOLESTEROL TOTAL: 192 mg/dL (ref 100–199)
HDL: 60 mg/dL (ref >39)
LDL CALC: 116 mg/dL — AB (ref 0–99)
TRIGLYCERIDES: 80 mg/dL (ref 0–149)
VLDL CHOLESTEROL CAL: 16 mg/dL (ref 5–40)

## 2018-02-14 LAB — HEPATIC FUNCTION PANEL
ALK PHOS: 60 IU/L (ref 39–117)
ALT: 23 IU/L (ref 0–32)
AST: 14 IU/L (ref 0–40)
Albumin: 4.4 g/dL (ref 3.5–5.5)
BILIRUBIN, DIRECT: 0.07 mg/dL (ref 0.00–0.40)
Bilirubin Total: <0.2 mg/dL (ref 0.0–1.2)
Total Protein: 6.9 g/dL (ref 6.0–8.5)

## 2018-02-14 LAB — VITAMIN D 25 HYDROXY (VIT D DEFICIENCY, FRACTURES): Vit D, 25-Hydroxy: 59.7 ng/mL (ref 30.0–100.0)

## 2018-04-04 IMAGING — DX DG CHEST 2V
2 series · 2 of 2 positions shown · non-contrast
Comparison: None.

CLINICAL DATA: Hypertension, right rib pain, health maintenance

EXAM:
CHEST  2 VIEW

[chest pa]
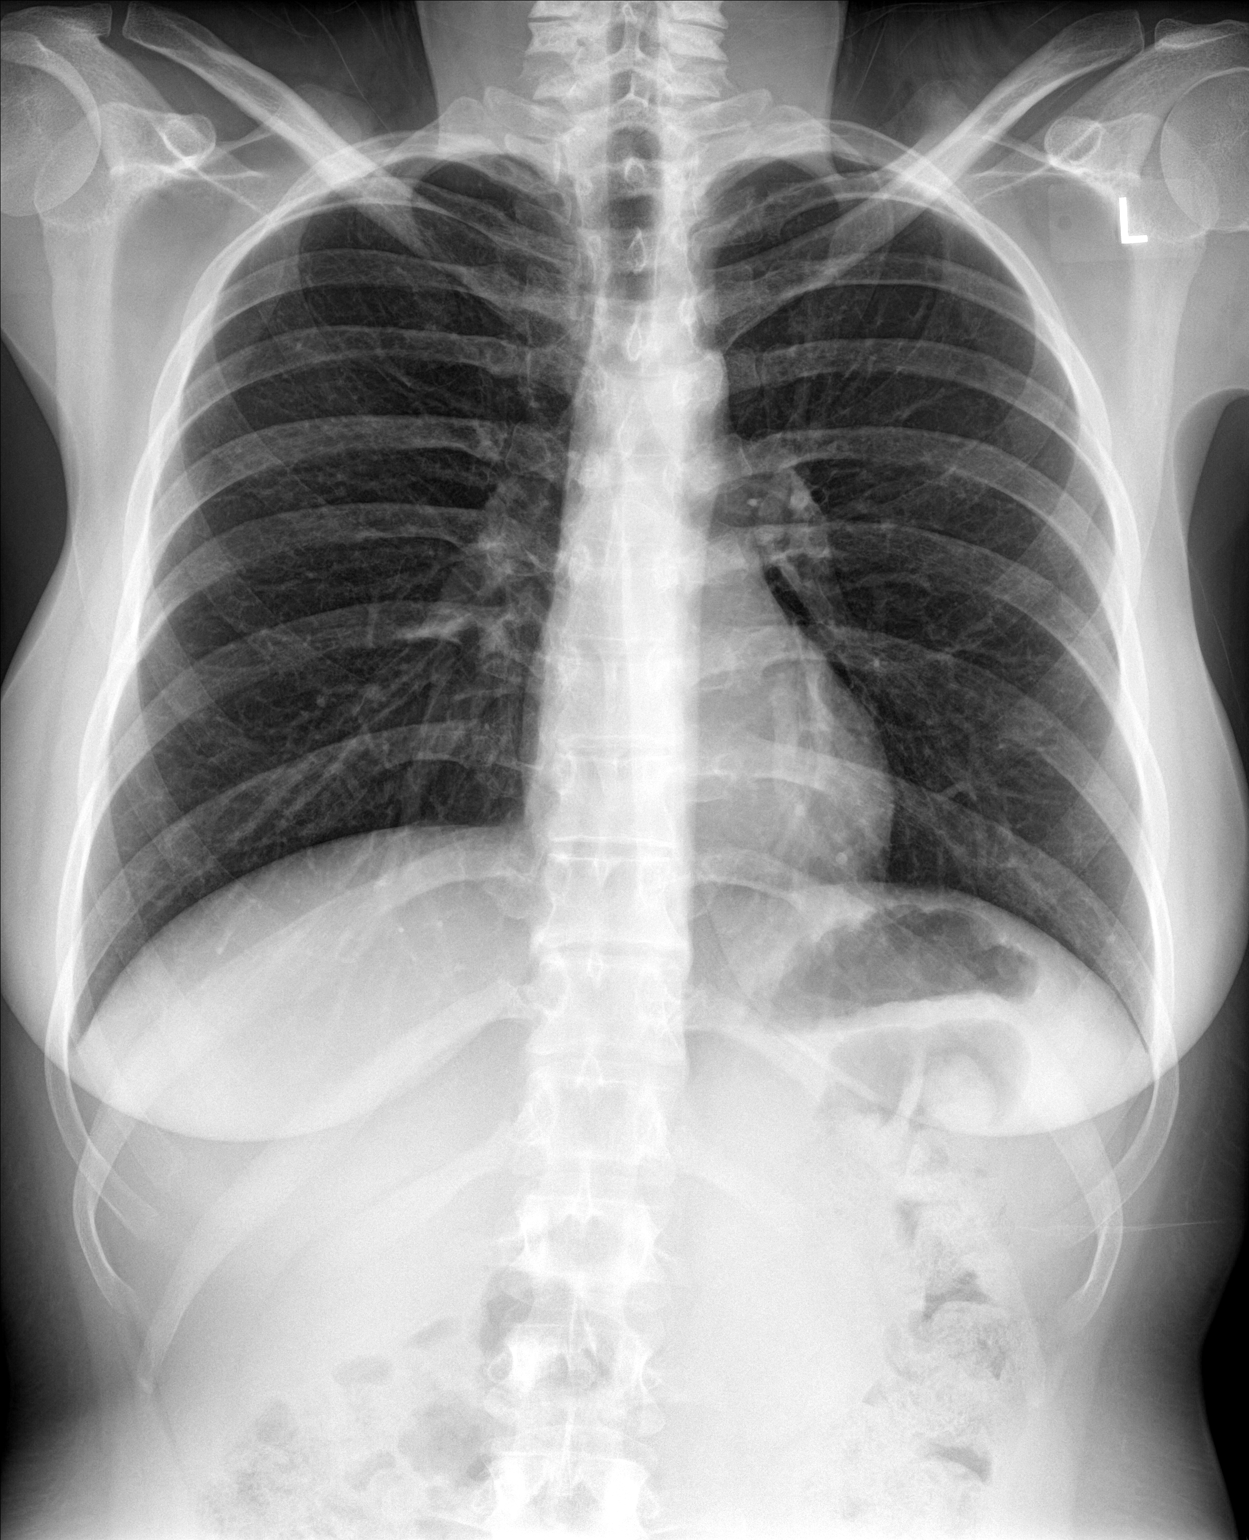

[chest lat]
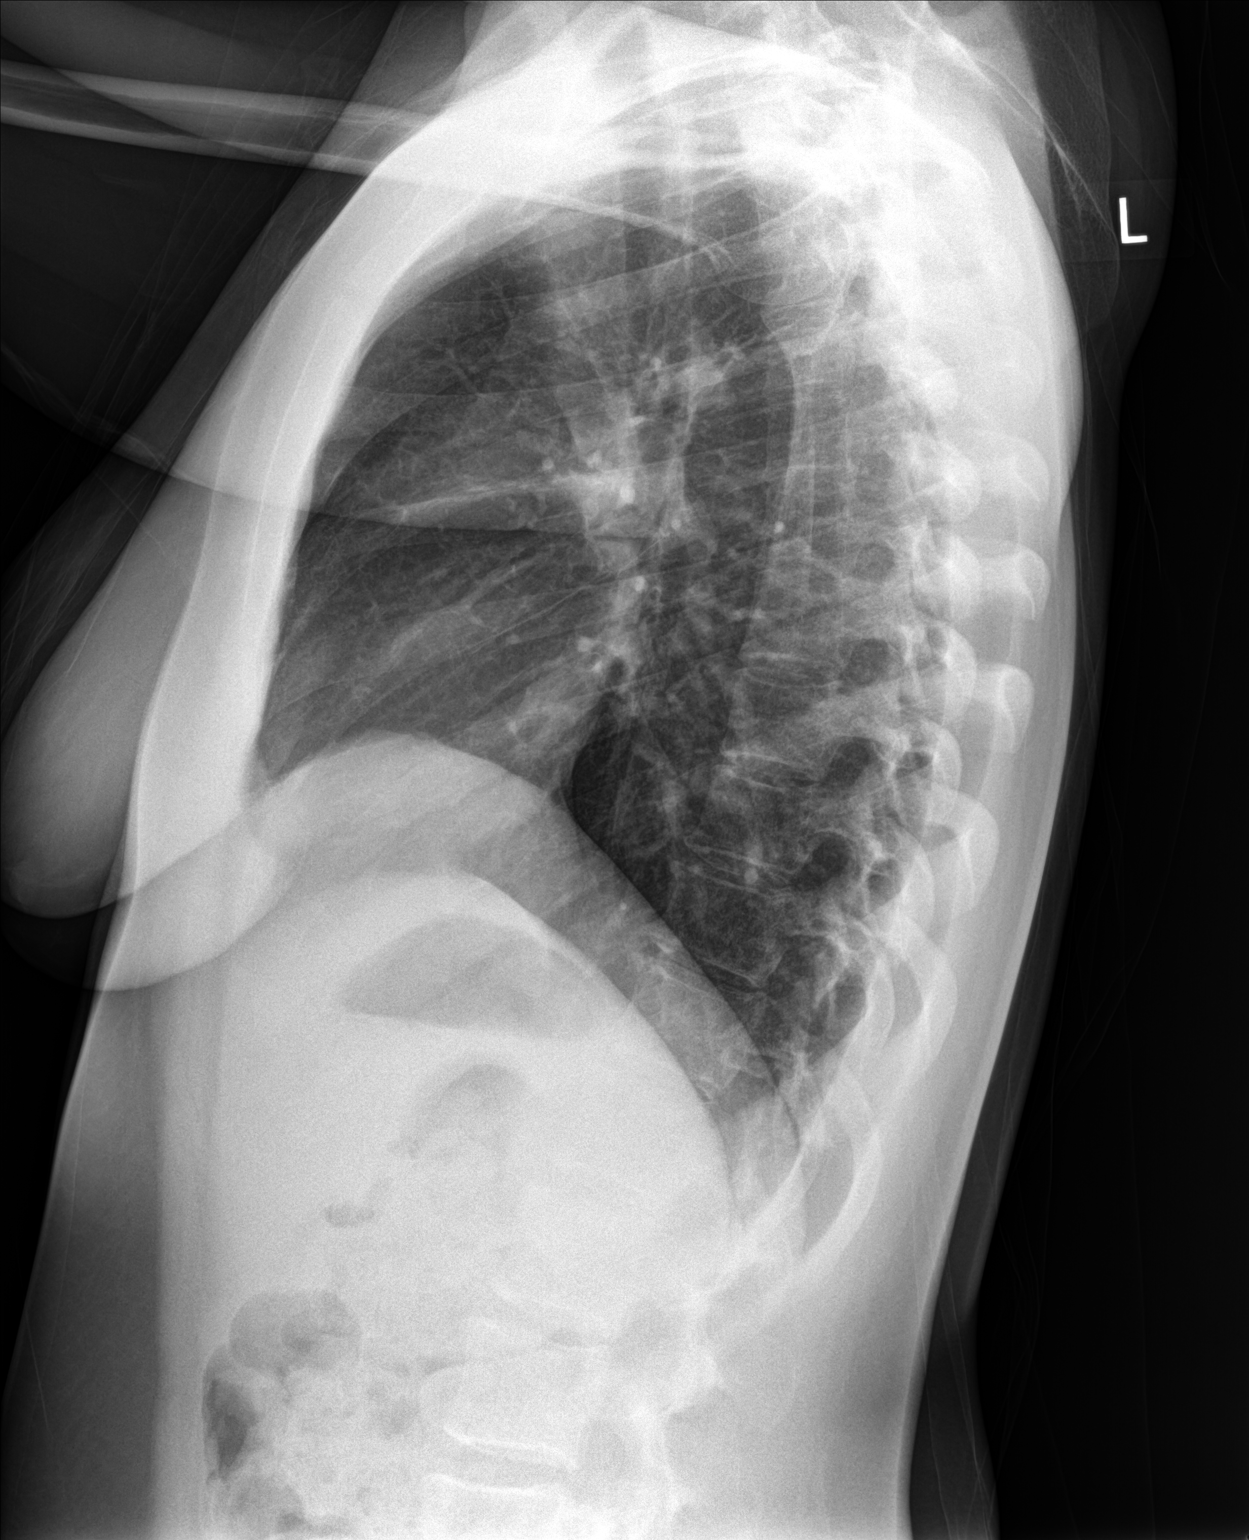

[2 of 2 positions shown; findings below may reference images not displayed]

FINDINGS: The heart size and mediastinal contours are within normal limits.
Both lungs are clear. The visualized skeletal structures are
unremarkable.
IMPRESSION: No active cardiopulmonary disease.

## 2018-08-12 ENCOUNTER — Other Ambulatory Visit: Payer: Self-pay | Admitting: Family Medicine

## 2018-08-13 NOTE — Telephone Encounter (Signed)
Last seen 02/13/18

## 2018-08-14 ENCOUNTER — Ambulatory Visit (INDEPENDENT_AMBULATORY_CARE_PROVIDER_SITE_OTHER): Payer: Medicaid Other | Admitting: Family Medicine

## 2018-08-14 ENCOUNTER — Encounter: Payer: Self-pay | Admitting: Family Medicine

## 2018-08-14 VITALS — BP 130/90 | HR 104 | Temp 98.6°F | Ht 66.0 in | Wt 174.0 lb

## 2018-08-14 DIAGNOSIS — E559 Vitamin D deficiency, unspecified: Secondary | ICD-10-CM

## 2018-08-14 DIAGNOSIS — F902 Attention-deficit hyperactivity disorder, combined type: Secondary | ICD-10-CM | POA: Diagnosis not present

## 2018-08-14 DIAGNOSIS — I10 Essential (primary) hypertension: Secondary | ICD-10-CM

## 2018-08-14 DIAGNOSIS — F952 Tourette's disorder: Secondary | ICD-10-CM

## 2018-08-14 NOTE — Progress Notes (Signed)
Subjective:    Patient ID: Tiffany Bates, female    DOB: [birth year], 28 y.o.   MRN: [id number]  HPI Pt here for follow up and management of chronic medical problems which includes hypertension. She is taking medication regularly.  This young lady is doing well overall.  She will come back next week for repeat lab work.  She will be given an FOBT to return.  She comes with her caregiver today.  Her father was just seen prior to her visit and said that she was doing well.  Repeat blood pressure was good at 128/84.  Her meds were reviewed and she will stay with her current treatment regimen.  Has been extremely worried about her grandmother who is in a nursing home with dementia.  She does not describe any injury to her arm.  She says it hurts about at the point of the deltoid insertion on the humerus in the left arm.  It was tender to palpation here.  The father says she is very suggestible and he would prefer more conservative treatment of course we agree with that.  She denies chest pain pressure tightness shortness of breath or change in bowel habits.  She is passing her water without problems.   Patient Active Problem List   Diagnosis Date Noted  . Encephalopathy chronic 04/29/2014  . Intellectual disability 04/29/2014  . ADHD (attention deficit hyperactivity disorder) 04/29/2014  . Obsessive compulsive disorder 04/29/2014  . HTN (hypertension) 04/29/2014   Outpatient Encounter Medications as of 08/14/2018  Medication Sig  . atomoxetine (STRATTERA) 80 MG capsule Take 80 mg by mouth daily.  . fluticasone (FLONASE) 50 MCG/ACT nasal spray Place 2 sprays into both nostrils daily.  Marland Kitchen lisinopril (PRINIVIL,ZESTRIL) 10 MG tablet TAKE 1 TABLET DAILY  . methylphenidate 54 MG PO CR tablet TAKE (1) TABLET DAILY IN THE MORNING.  Marland Kitchen norethindrone-ethinyl estradiol 1/35 (ORTHO-NOVUM, NORTREL,CYCLAFEM) tablet Take 1 tablet by mouth daily.  . Oxcarbazepine (TRILEPTAL) 300 MG tablet Take 900 mg by mouth 2  (two) times daily.  Marland Kitchen PARoxetine (PAXIL) 40 MG tablet Take 40 mg by mouth every morning.   No facility-administered encounter medications on file as of 08/14/2018.       Review of Systems  Constitutional: Negative.   HENT: Negative.   Eyes: Negative.   Respiratory: Negative.   Cardiovascular: Negative.   Gastrointestinal: Negative.   Endocrine: Negative.   Genitourinary: Negative.   Musculoskeletal: Positive for arthralgias (left arm pain at times ).  Skin: Negative.   Allergic/Immunologic: Negative.   Neurological: Negative.   Hematological: Negative.   Psychiatric/Behavioral: Negative.        Objective:   Physical Exam Vitals signs and nursing note reviewed.  Constitutional:      General: She is not in acute distress.    Appearance: Normal appearance. She is well-developed and normal weight.     Comments: The patient was pleasant though somewhat anxious and nervous due to her health condition.  Her care provider was not with her today her dad came with her but was not in the room.  HENT:     Head: Normocephalic and atraumatic.     Right Ear: Tympanic membrane, ear canal and external ear normal. There is no impacted cerumen.     Left Ear: Tympanic membrane, ear canal and external ear normal. There is no impacted cerumen.     Nose: Nose normal. No congestion or rhinorrhea.     Mouth/Throat:     Mouth: Mucous membranes are  moist.     Pharynx: Oropharynx is clear. No oropharyngeal exudate.  Eyes:     General: No scleral icterus.       Right eye: No discharge.        Left eye: No discharge.     Extraocular Movements: Extraocular movements intact.     Conjunctiva/sclera: Conjunctivae normal.     Pupils: Pupils are equal, round, and reactive to light.  Neck:     Musculoskeletal: Normal range of motion and neck supple.     Thyroid: No thyromegaly.     Vascular: No carotid bruit or JVD.  Cardiovascular:     Rate and Rhythm: Normal rate and regular rhythm.     Pulses:  Normal pulses.     Heart sounds: Normal heart sounds. No murmur.     Comments: The heart was regular at 84/min with good pedal pulses and no edema Pulmonary:     Effort: Pulmonary effort is normal.     Breath sounds: Normal breath sounds. No wheezing or rales.  Abdominal:     General: Abdomen is flat. Bowel sounds are normal.     Palpations: Abdomen is soft. There is no mass.     Tenderness: There is no abdominal tenderness. There is no guarding.  Musculoskeletal: Normal range of motion.        General: Tenderness present.     Right lower leg: No edema.     Left lower leg: No edema.     Comments: Patient had good mobility of the left upper arm.  She just says it is tender at this point of the deltoid muscle insertion on the humerus.  We debated about giving her a shot of Depo-Medrol but after talking with the father we will try anti-inflammatory medicines first and have her take Aleve twice daily after breakfast and supper for 7 to 10 days and use warm wet compresses for 20 minutes 3 or 4 times daily for the next week.  Lymphadenopathy:     Cervical: No cervical adenopathy.  Skin:    General: Skin is warm and dry.     Findings: No rash.  Neurological:     General: No focal deficit present.     Mental Status: She is alert and oriented to person, place, and time. Mental status is at baseline.     Cranial Nerves: No cranial nerve deficit.     Gait: Gait normal.     Deep Tendon Reflexes: Reflexes are normal and symmetric. Reflexes normal.  Psychiatric:        Mood and Affect: Mood normal.        Behavior: Behavior normal.        Thought Content: Thought content normal.        Judgment: Judgment normal.     Comments: Mood affect and behavior are anxious and nervous.  This is typical for her.    BP 130/90 (BP Location: Left Arm)   Pulse (!) 104   Temp 98.6 F (37 C) (Oral)   Ht '5\' 6"'  (1.676 m)   Wt 174 lb (78.9 kg)   BMI 28.08 kg/m        Assessment & Plan:  1. Essential  hypertension -The blood pressure is good she will continue with current treatment - BMP8+EGFR; Future - CBC with Differential/Platelet; Future - Lipid panel; Future - Hepatic function panel; Future  2. Attention deficit hyperactivity disorder (ADHD), combined type -Continue with current medicines - CBC with Differential/Platelet; Future  3. Vitamin D  deficiency -Continue with vitamin D replacement pending results of lab work - CBC with Differential/Platelet; Future  4. Tourette syndrome -Continue with current treatment regimen - CBC with Differential/Platelet; Future  Patient Instructions  Continue current medications. Continue good therapeutic lifestyle changes which include good diet and exercise. Fall precautions discussed with patient. If an FOBT was given today- please return it to our front desk. If you are over 32 years old - you may need Prevnar 51 or the adult Pneumonia vaccine.  **Flu shots are available--- please call and schedule a FLU-CLINIC appointment**  After your visit with [place] today you will receive a survey in the mail or online from [employer] regarding your care with [place]. Please take a moment to fill this out. Your feedback is very important to [place] as you can help [place] better understand your patient needs as well as improve your experience and satisfaction. WE CARE ABOUT YOU!!!   Apply warm wet compresses 20 minutes 3 or 4 times daily to the left upper arm and take Aleve twice daily after breakfast and supper for 7 to 10 days and this should help take care of the tendinitis in the arm and the area of tenderness the patient was concerned about. She should continue to drink plenty of fluids and stay well-hydrated She should stay active physically and walk regularly  Arrie Senate MD

## 2018-08-14 NOTE — Patient Instructions (Addendum)
Continue current medications. Continue good therapeutic lifestyle changes which include good diet and exercise. Fall precautions discussed with patient. If an FOBT was given today- please return it to our front desk. If you are over 28 years old - you may need Prevnar 13 or the adult Pneumonia vaccine.  **Flu shots are available--- please call and schedule a FLU-CLINIC appointment**  After your visit with [place] today you will receive a survey in the mail or online from [employer] regarding your care with [place]. Please take a moment to fill this out. Your feedback is very important to [place] as you can help [place] better understand your patient needs as well as improve your experience and satisfaction. WE CARE ABOUT YOU!!!   Apply warm wet compresses 20 minutes 3 or 4 times daily to the left upper arm and take Aleve twice daily after breakfast and supper for 7 to 10 days and this should help take care of the tendinitis in the arm and the area of tenderness the patient was concerned about. She should continue to drink plenty of fluids and stay well-hydrated She should stay active physically and walk regularly

## 2018-08-22 ENCOUNTER — Other Ambulatory Visit: Payer: Medicaid Other

## 2018-08-22 DIAGNOSIS — I10 Essential (primary) hypertension: Secondary | ICD-10-CM

## 2018-08-22 DIAGNOSIS — E559 Vitamin D deficiency, unspecified: Secondary | ICD-10-CM

## 2018-08-22 DIAGNOSIS — F902 Attention-deficit hyperactivity disorder, combined type: Secondary | ICD-10-CM

## 2018-08-22 DIAGNOSIS — F952 Tourette's disorder: Secondary | ICD-10-CM

## 2018-08-23 LAB — BMP8+EGFR
BUN/Creatinine Ratio: 24 — ABNORMAL HIGH (ref 9–23)
BUN: 17 mg/dL (ref 6–20)
CO2: 23 mmol/L (ref 20–29)
Calcium: 9.3 mg/dL (ref 8.7–10.2)
Chloride: 100 mmol/L (ref 96–106)
Creatinine, Ser: 0.7 mg/dL (ref 0.57–1.00)
GFR calc Af Amer: 136 mL/min/{1.73_m2} (ref >59)
GFR calc non Af Amer: 118 mL/min/{1.73_m2} (ref >59)
Glucose: 83 mg/dL (ref 65–99)
Potassium: 4.9 mmol/L (ref 3.5–5.2)
SODIUM: 134 mmol/L (ref 134–144)

## 2018-08-23 LAB — CBC WITH DIFFERENTIAL/PLATELET
Basophils Absolute: 0 10*3/uL (ref 0.0–0.2)
Basos: 0 %
EOS (ABSOLUTE): 0.2 10*3/uL (ref 0.0–0.4)
Eos: 3 %
Hematocrit: 38.4 % (ref 34.0–46.6)
Hemoglobin: 13 g/dL (ref 11.1–15.9)
Immature Grans (Abs): 0 10*3/uL (ref 0.0–0.1)
Immature Granulocytes: 0 %
Lymphocytes Absolute: 2.3 10*3/uL (ref 0.7–3.1)
Lymphs: 38 %
MCH: 29.9 pg (ref 26.6–33.0)
MCHC: 33.9 g/dL (ref 31.5–35.7)
MCV: 88 fL (ref 79–97)
Monocytes Absolute: 0.5 10*3/uL (ref 0.1–0.9)
Monocytes: 8 %
Neutrophils Absolute: 3.1 10*3/uL (ref 1.4–7.0)
Neutrophils: 51 %
Platelets: 317 10*3/uL (ref 150–450)
RBC: 4.35 x10E6/uL (ref 3.77–5.28)
RDW: 11.9 % (ref 11.7–15.4)
WBC: 6 10*3/uL (ref 3.4–10.8)

## 2018-08-23 LAB — HEPATIC FUNCTION PANEL
ALBUMIN: 4.1 g/dL (ref 3.9–5.0)
ALK PHOS: 55 IU/L (ref 39–117)
ALT: 21 IU/L (ref 0–32)
AST: 19 IU/L (ref 0–40)
BILIRUBIN TOTAL: 0.2 mg/dL (ref 0.0–1.2)
Bilirubin, Direct: 0.08 mg/dL (ref 0.00–0.40)
Total Protein: 6.7 g/dL (ref 6.0–8.5)

## 2018-08-23 LAB — LIPID PANEL
Chol/HDL Ratio: 3.3 ratio (ref 0.0–4.4)
Cholesterol, Total: 189 mg/dL (ref 100–199)
HDL: 57 mg/dL (ref >39)
LDL Calculated: 113 mg/dL — ABNORMAL HIGH (ref 0–99)
Triglycerides: 94 mg/dL (ref 0–149)
VLDL CHOLESTEROL CAL: 19 mg/dL (ref 5–40)

## 2018-09-11 ENCOUNTER — Other Ambulatory Visit: Payer: Self-pay | Admitting: Family Medicine

## 2018-10-28 ENCOUNTER — Other Ambulatory Visit: Payer: Self-pay | Admitting: *Deleted

## 2018-10-28 MED ORDER — LISINOPRIL 10 MG PO TABS: 10.0000 mg | ORAL_TABLET | Freq: Every day | ORAL | 0 refills | Status: DC

## 2018-11-19 ENCOUNTER — Other Ambulatory Visit: Payer: Self-pay | Admitting: Family Medicine

## 2018-12-12 ENCOUNTER — Other Ambulatory Visit: Payer: Self-pay | Admitting: Family Medicine

## 2019-01-03 ENCOUNTER — Encounter: Payer: Self-pay | Admitting: *Deleted

## 2019-02-12 ENCOUNTER — Ambulatory Visit: Payer: Medicaid Other | Admitting: Family Medicine

## 2019-02-19 ENCOUNTER — Other Ambulatory Visit: Payer: Self-pay

## 2019-02-20 ENCOUNTER — Encounter: Payer: Self-pay | Admitting: Family Medicine

## 2019-02-20 ENCOUNTER — Ambulatory Visit (INDEPENDENT_AMBULATORY_CARE_PROVIDER_SITE_OTHER): Payer: Medicaid Other | Admitting: Family Medicine

## 2019-02-20 VITALS — BP 128/82 | HR 100 | Temp 99.3°F | Ht 66.0 in | Wt 190.0 lb

## 2019-02-20 DIAGNOSIS — I10 Essential (primary) hypertension: Secondary | ICD-10-CM

## 2019-02-20 DIAGNOSIS — F411 Generalized anxiety disorder: Secondary | ICD-10-CM | POA: Diagnosis not present

## 2019-02-20 DIAGNOSIS — F902 Attention-deficit hyperactivity disorder, combined type: Secondary | ICD-10-CM | POA: Diagnosis not present

## 2019-02-20 DIAGNOSIS — Z6830 Body mass index (BMI) 30.0-30.9, adult: Secondary | ICD-10-CM

## 2019-02-20 DIAGNOSIS — F79 Unspecified intellectual disabilities: Secondary | ICD-10-CM

## 2019-02-20 DIAGNOSIS — F952 Tourette's disorder: Secondary | ICD-10-CM | POA: Diagnosis not present

## 2019-02-20 DIAGNOSIS — J301 Allergic rhinitis due to pollen: Secondary | ICD-10-CM

## 2019-02-20 DIAGNOSIS — F422 Mixed obsessional thoughts and acts: Secondary | ICD-10-CM

## 2019-02-20 MED ORDER — LISINOPRIL 10 MG PO TABS: 10.0000 mg | ORAL_TABLET | Freq: Every day | ORAL | 1 refills | Status: DC

## 2019-02-20 MED ORDER — FLUTICASONE PROPIONATE 50 MCG/ACT NA SUSP: 1.0000 | Freq: Every day | NASAL | 0 refills | Status: DC

## 2019-02-20 NOTE — Patient Instructions (Addendum)
BP slightly elevated. Watch salt intake.  Labs updated today.  Flu shot next month.   DASH Eating Plan DASH stands for "Dietary Approaches to Stop Hypertension." The DASH eating plan is a healthy eating plan that has been shown to reduce high blood pressure (hypertension). Additional health benefits may include reducing the risk of type 2 diabetes mellitus, heart disease, and stroke. The DASH eating plan may also help with weight loss.  WHAT DO I NEED TO KNOW ABOUT THE DASH EATING PLAN? For the DASH eating plan, you will follow these general guidelines:  Choose foods with a percent daily value for sodium of less than 5% (as listed on the food label).  Use salt-free seasonings or herbs instead of table salt or sea salt.  Check with your health care provider or pharmacist before using salt substitutes.  Eat lower-sodium products, often labeled as "lower sodium" or "no salt added."  Eat fresh foods.  Eat more vegetables, fruits, and low-fat dairy products.  Choose whole grains. Look for the word "whole" as the first word in the ingredient list.  Choose fish and skinless chicken or [place] more often than red meat. Limit fish, poultry, and meat to 6 oz (170 g) each day.  Limit sweets, desserts, sugars, and sugary drinks.  Choose heart-healthy fats.  Limit cheese to 1 oz (28 g) per day.  Eat more home-cooked food and less restaurant, buffet, and fast food.  Limit fried foods.  Cook foods using methods other than frying.  Limit canned vegetables. If you do use them, rinse them well to decrease the sodium.  When eating at a restaurant, ask that your food be prepared with less salt, or no salt if possible.  WHAT FOODS CAN I EAT? Seek help from a dietitian for individual calorie needs.  Grains Whole grain or whole wheat bread. Brown rice. Whole grain or whole wheat pasta. Quinoa, bulgur, and whole grain cereals. Low-sodium cereals. Corn or whole wheat flour tortillas. Whole grain  cornbread. Whole grain crackers. Low-sodium crackers.  Vegetables Fresh or frozen vegetables (raw, steamed, roasted, or grilled). Low-sodium or reduced-sodium tomato and vegetable juices. Low-sodium or reduced-sodium tomato sauce and paste. Low-sodium or reduced-sodium canned vegetables.   Fruits All fresh, canned (in natural juice), or frozen fruits.  Meat and Other Protein Products Ground beef (85% or leaner), grass-fed beef, or beef trimmed of fat. Skinless chicken or [place]. Ground chicken or [place]. Pork trimmed of fat. All fish and seafood. Eggs. Dried beans, peas, or lentils. Unsalted nuts and seeds. Unsalted canned beans.  Dairy Low-fat dairy products, such as skim or 1% milk, 2% or reduced-fat cheeses, low-fat ricotta or cottage cheese, or plain low-fat yogurt. Low-sodium or reduced-sodium cheeses.  Fats and Oils Tub margarines without trans fats. Light or reduced-fat mayonnaise and salad dressings (reduced sodium). Avocado. Safflower, olive, or canola oils. Natural peanut or almond butter.  Other Unsalted popcorn and pretzels. The items listed above may not be a complete list of recommended foods or beverages. Contact your dietitian for more options.  WHAT FOODS ARE NOT RECOMMENDED?  Grains White bread. White pasta. White rice. Refined cornbread. Bagels and croissants. Crackers that contain trans fat.  Vegetables Creamed or fried vegetables. Vegetables in a cheese sauce. Regular canned vegetables. Regular canned tomato sauce and paste. Regular tomato and vegetable juices.  Fruits Dried fruits. Canned fruit in light or heavy syrup. Fruit juice.  Meat and Other Protein Products Fatty cuts of meat. Ribs, chicken wings, bacon, sausage, bologna, salami, chitterlings,  fatback, hot dogs, bratwurst, and packaged luncheon meats. Salted nuts and seeds. Canned beans with salt.  Dairy Whole or 2% milk, cream, half-and-half, and cream cheese. Whole-fat or sweetened yogurt. Full-fat  cheeses or blue cheese. Nondairy creamers and whipped toppings. Processed cheese, cheese spreads, or cheese curds.  Condiments Onion and garlic salt, seasoned salt, table salt, and sea salt. Canned and packaged gravies. Worcestershire sauce. Tartar sauce. Barbecue sauce. Teriyaki sauce. Soy sauce, including reduced sodium. Steak sauce. Fish sauce. Oyster sauce. Cocktail sauce. Horseradish. Ketchup and mustard. Meat flavorings and tenderizers. Bouillon cubes. Hot sauce. Tabasco sauce. Marinades. Taco seasonings. Relishes.  Fats and Oils Butter, stick margarine, lard, shortening, ghee, and bacon fat. Coconut, palm kernel, or palm oils. Regular salad dressings.  Other Pickles and olives. Salted popcorn and pretzels.  The items listed above may not be a complete list of foods and beverages to avoid. Contact your dietitian for more information.  WHERE CAN I FIND MORE INFORMATION? National Heart, Lung, and Blood Institute: [employer].itwww.nhlbi.nih.gov/health/health-topics/topics/dash/ Document Released: 05/25/2011 Document Revised: 10/20/2013 Document Reviewed: 04/09/2013 [facility] Patient Information 2015 [place], [place]. This information is not intended to replace advice given to you by your health care provider. Make sure you discuss any questions you have with your health care provider.   I think that you would greatly benefit from seeing a nutritionist.  If you are interested, please call Dr Gerilyn PilgrimSykes at [phone] to schedule an appointment.

## 2019-02-20 NOTE — Progress Notes (Signed)
Subjective:  Patient ID: Tiffany Bates, female    DOB: August 27, 1990, 28 y.o.   MRN: [id number]  Patient Care Team: Chipper Herb, MD as PCP - General (Family Medicine)   Chief Complaint:  Medical Management of Chronic Issues ((moore pt) ) and Hypertension   HPI: Tiffany Bates is a 28 y.o. female presenting on 02/20/2019 for Medical Management of Chronic Issues ((moore pt) ) and Hypertension   1. Essential hypertension  Complaint with meds - Yes Current Medications - lisinopril Checking BP at home - No Exercising Regularly - No Watching Salt intake - No Pertinent ROS:  Headache - No Fatigue - No Visual Disturbances - No Chest pain - No Dyspnea - No Palpitations - No LE edema - No They report good compliance with medications and can restate their regimen by memory. No medication side effects.  Family, social, and smoking history reviewed.   BP Readings from Last 3 Encounters:  02/20/19 128/82  08/14/18 130/90  02/13/18 117/80   CMP Latest Ref Rng & Units 08/22/2018 02/13/2018 09/12/2016  Glucose 65 - 99 mg/dL 83 89 86  BUN 6 - 20 mg/dL _0 Creatinine 0.57 - 1.00 mg/dL 0.70 0.67 0.63  Sodium 134 - 144 mmol/L 134 136 142  Potassium 3.5 - 5.2 mmol/L 4.9 4.6 4.6  Chloride 96 - 106 mmol/L 100 100 104  CO2 20 - 29 mmol/L _1 Calcium 8.7 - 10.2 mg/dL 9.3 9.7 9.1  Total Protein 6.0 - 8.5 g/dL 6.7 6.9 6.4  Total Bilirubin 0.0 - 1.2 mg/dL 0.2 <0.2 <0.2  Alkaline Phos 39 - 117 IU/L 55 60 45  AST 0 - 40 IU/L _2 ALT 0 - 32 IU/L _3 2. Tourette syndrome   3. Attention deficit hyperactivity disorder (ADHD), combined type   4. Generalized anxiety disorder   5. Intellectual disability   6. Mixed obsessional thoughts and acts  Followed by psychiatry. Doing well on current medications.    7. Seasonal allergic rhinitis due to pollen  Well controlled with as needed Flonase.    8. BMI 30.0-30.9,adult  Does not watch diet or exercise on a regular  basis.      Relevant past medical, surgical, family, and social history reviewed and updated as indicated.  Allergies and medications reviewed and updated. Date reviewed: Chart in Epic.   Past Medical History:  Diagnosis Date   ADHD (attention deficit hyperactivity disorder)    Hypertension    Intellectual disability    Tourette syndrome     Past Surgical History:  Procedure Laterality Date   TONSILLECTOMY AND ADENOIDECTOMY      Social History   Socioeconomic History   Marital status: Single    Spouse name: Not on file   Number of children: Not on file   Years of education: Not on file   Highest education level: Not on file  Occupational History   Not on file  Social Needs   Financial resource strain: Not on file   Food insecurity    Worry: Not on file    Inability: Not on file   Transportation needs    Medical: Not on file    Non-medical: Not on file  Tobacco Use   Smoking status: Never Smoker   Smokeless tobacco: Never Used  Substance and Sexual Activity   Alcohol use: No   Drug use: No   Sexual activity: Not on file  Lifestyle   Physical activity    Days per week: Not on file    Minutes per session: Not on file   Stress: Not on file  Relationships   Social connections    Talks on phone: Not on file    Gets together: Not on file    Attends religious service: Not on file    Active member of club or organization: Not on file    Attends meetings of clubs or organizations: Not on file    Relationship status: Not on file   Intimate partner violence    Fear of current or ex partner: Not on file    Emotionally abused: Not on file    Physically abused: Not on file    Forced sexual activity: Not on file  Other Topics Concern   Not on file  Social History Narrative   Not on file    Outpatient Encounter Medications as of 02/20/2019  Medication Sig   atomoxetine (STRATTERA) 80 MG capsule Take 1 capsule by mouth daily.    fluticasone (FLONASE) 50 MCG/ACT nasal spray Place 1 spray into both nostrils daily.   lisinopril (ZESTRIL) 10 MG tablet Take 1 tablet (10 mg total) by mouth daily.   methylphenidate 54 MG PO CR tablet TAKE (1) TABLET DAILY IN THE MORNING.   norethindrone-ethinyl estradiol 1/35 (ORTHO-NOVUM, NORTREL,CYCLAFEM) tablet Take 1 tablet by mouth daily.   Oxcarbazepine (TRILEPTAL) 300 MG tablet Take 900 mg by mouth 2 (two) times daily.   PARoxetine (PAXIL) 40 MG tablet Take 40 mg by mouth every morning.   [DISCONTINUED] atomoxetine (STRATTERA) 80 MG capsule Take 80 mg by mouth daily.   [DISCONTINUED] fluticasone (FLONASE) 50 MCG/ACT nasal spray PLACE TWO SPRAYS INTO EACH NOSTRIL ONCE DAILY   [DISCONTINUED] lisinopril (ZESTRIL) 10 MG tablet Take 1 tablet (10 mg total) by mouth daily.   No facility-administered encounter medications on file as of 02/20/2019.     No Known Allergies  Review of Systems  Constitutional: Negative for activity change, appetite change, chills, diaphoresis, fatigue, fever and unexpected weight change.  HENT: Negative.   Eyes: Negative.   Respiratory: Negative for cough, chest tightness and shortness of breath.   Cardiovascular: Negative for chest pain, palpitations and leg swelling.  Gastrointestinal: Negative for abdominal distention, abdominal pain, anal bleeding, blood in stool, constipation, diarrhea, nausea, rectal pain and vomiting.  Endocrine: Negative.   Genitourinary: Negative for decreased urine volume, difficulty urinating, dysuria, frequency and urgency.  Musculoskeletal: Negative for arthralgias and myalgias.  Skin: Negative.   Allergic/Immunologic: Negative.   Neurological: Negative for dizziness, tremors, seizures, syncope, facial asymmetry, speech difficulty, weakness, light-headedness, numbness and headaches.  Hematological: Negative.  Does not bruise/bleed easily.  Psychiatric/Behavioral: Positive for behavioral problems, decreased concentration  and sleep disturbance. Negative for agitation, confusion, dysphoric mood, hallucinations, self-injury and suicidal ideas. The patient is nervous/anxious and is hyperactive.   All other systems reviewed and are negative.       Objective:  BP 128/82 (BP Location: Right Leg, Cuff Size: Normal)    Pulse 100    Temp 99.3 F (37.4 C)    Ht 5' 6" (1.676 m)    Wt 190 lb (86.2 kg)    BMI 30.67 kg/m    Wt Readings from Last 3 Encounters:  02/20/19 190 lb (86.2 kg)  08/14/18 174 lb (78.9 kg)  02/13/18 167 lb (75.8 kg)    Physical Exam Vitals signs and nursing note reviewed.  Constitutional:      General: She  is not in acute distress.    Appearance: Normal appearance. She is well-developed and well-groomed. She is obese. She is not ill-appearing, toxic-appearing or diaphoretic.  HENT:     Head: Normocephalic and atraumatic.     Jaw: There is normal jaw occlusion.     Right Ear: Hearing, tympanic membrane, ear canal and external ear normal.     Left Ear: Hearing, tympanic membrane, ear canal and external ear normal.     Nose: Nose normal.     Mouth/Throat:     Lips: Pink.     Mouth: Mucous membranes are moist.     Pharynx: Oropharynx is clear. Uvula midline.  Eyes:     General: Lids are normal.     Extraocular Movements: Extraocular movements intact.     Conjunctiva/sclera: Conjunctivae normal.     Pupils: Pupils are equal, round, and reactive to light.  Neck:     Musculoskeletal: Normal range of motion and neck supple.     Thyroid: No thyroid mass, thyromegaly or thyroid tenderness.     Vascular: No carotid bruit or JVD.     Trachea: Trachea and phonation normal.  Cardiovascular:     Rate and Rhythm: Normal rate and regular rhythm.     Chest Wall: PMI is not displaced.     Pulses: Normal pulses.     Heart sounds: Normal heart sounds. No murmur. No friction rub. No gallop.   Pulmonary:     Effort: Pulmonary effort is normal. No respiratory distress.     Breath sounds: Normal  breath sounds. No wheezing.  Abdominal:     General: Bowel sounds are normal. There is no distension or abdominal bruit.     Palpations: Abdomen is soft. There is no hepatomegaly or splenomegaly.     Tenderness: There is no abdominal tenderness. There is no right CVA tenderness or left CVA tenderness.     Hernia: No hernia is present.  Musculoskeletal: Normal range of motion.     Right lower leg: No edema.     Left lower leg: No edema.  Lymphadenopathy:     Cervical: No cervical adenopathy.  Skin:    General: Skin is warm and dry.     Capillary Refill: Capillary refill takes less than 2 seconds.     Coloration: Skin is not cyanotic, jaundiced or pale.     Findings: No rash.  Neurological:     General: No focal deficit present.     Mental Status: She is alert and oriented to person, place, and time.     Cranial Nerves: Cranial nerves are intact.     Sensory: Sensation is intact.     Motor: Motor function is intact.     Coordination: Coordination is intact.     Gait: Gait is intact.     Deep Tendon Reflexes: Reflexes are normal and symmetric.  Psychiatric:        Attention and Perception: Attention and perception normal.        Mood and Affect: Affect normal. Mood is anxious.        Speech: Speech is rapid and pressured.        Behavior: Behavior normal. Behavior is cooperative.        Thought Content: Thought content normal.     Comments: Pt has ADHD, GAD, and tourette syndrome. Pt is baseline today, slightly anxious. Per care giver this is normal.     Results for orders placed or performed in visit on 08/22/18  Hepatic  function panel  Result Value Ref Range   Total Protein 6.7 6.0 - 8.5 g/dL   Albumin 4.1 3.9 - 5.0 g/dL   Bilirubin Total 0.2 0.0 - 1.2 mg/dL   Bilirubin, Direct 0.08 0.00 - 0.40 mg/dL   Alkaline Phosphatase 55 39 - 117 IU/L   AST 19 0 - 40 IU/L   ALT 21 0 - 32 IU/L  Lipid panel  Result Value Ref Range   Cholesterol, Total 189 100 - 199 mg/dL    Triglycerides 94 0 - 149 mg/dL   HDL 57 >39 mg/dL   VLDL Cholesterol Cal 19 5 - 40 mg/dL   LDL Calculated 113 (H) 0 - 99 mg/dL   Chol/HDL Ratio 3.3 0.0 - 4.4 ratio  CBC with Differential/Platelet  Result Value Ref Range   WBC 6.0 3.4 - 10.8 x10E3/uL   RBC 4.35 3.77 - 5.28 x10E6/uL   Hemoglobin 13.0 11.1 - 15.9 g/dL   Hematocrit 38.4 34.0 - 46.6 %   MCV 88 79 - 97 fL   MCH 29.9 26.6 - 33.0 pg   MCHC 33.9 31.5 - 35.7 g/dL   RDW 11.9 11.7 - 15.4 %   Platelets 317 150 - 450 x10E3/uL   Neutrophils 51 Not Estab. %   Lymphs 38 Not Estab. %   Monocytes 8 Not Estab. %   Eos 3 Not Estab. %   Basos 0 Not Estab. %   Neutrophils Absolute 3.1 1.4 - 7.0 x10E3/uL   Lymphocytes Absolute 2.3 0.7 - 3.1 x10E3/uL   Monocytes Absolute 0.5 0.1 - 0.9 x10E3/uL   EOS (ABSOLUTE) 0.2 0.0 - 0.4 x10E3/uL   Basophils Absolute 0.0 0.0 - 0.2 x10E3/uL   Immature Granulocytes 0 Not Estab. %   Immature Grans (Abs) 0.0 0.0 - 0.1 x10E3/uL  BMP8+EGFR  Result Value Ref Range   Glucose 83 65 - 99 mg/dL   BUN 17 6 - 20 mg/dL   Creatinine, Ser 0.70 0.57 - 1.00 mg/dL   GFR calc non Af Amer 118 >59 mL/min/1.73   GFR calc Af Amer 136 >59 mL/min/1.73   BUN/Creatinine Ratio 24 (H) 9 - 23   Sodium 134 134 - 144 mmol/L   Potassium 4.9 3.5 - 5.2 mmol/L   Chloride 100 96 - 106 mmol/L   CO2 23 20 - 29 mmol/L   Calcium 9.3 8.7 - 10.2 mg/dL       Pertinent labs & imaging results that were available during my care of the patient were reviewed by me and considered in my medical decision making.  Assessment & Plan:  Tiffany Bates was seen today for medical management of chronic issues and hypertension.  Diagnoses and all orders for this visit:  Essential hypertension BP fairly controlled. Changes were not made in regimen today. Pt aware to watch sodium intake. Daily blood pressure log given with instructions on how to fill out and told to bring to next visit. Gaol BP 130/80. Pt aware to report any persistent high or low  readings. DASH diet and exercise encouraged. Exercise at least 150 minutes per week and increase as tolerated. Goal BMI > 25. Stress management encouraged. Smoking cessation discussed. Avoid excessive alcohol. Avoid NSAID's. Avoid more than 2000 mg of sodium daily. Medications as prescribed. Follow up as scheduled.  -     lisinopril (ZESTRIL) 10 MG tablet; Take 1 tablet (10 mg total) by mouth daily. -     CMP14+EGFR -     CBC with Differential/Platelet -  Thyroid Panel With TSH  Tourette syndrome Attention deficit hyperactivity disorder (ADHD), combined type Generalized anxiety disorder Intellectual disability Mixed obsessional thoughts and acts Followed by psychiatry. Doing well on current medications.   Seasonal allergic rhinitis due to pollen Well controlled. Uses Flonase PRN.  -     fluticasone (FLONASE) 50 MCG/ACT nasal spray; Place 1 spray into both nostrils daily.  BMI 30.0-30.9,adult Diet and exercise encouraged.  -     CMP14+EGFR -     CBC with Differential/Platelet -     Lipid panel -     Thyroid Panel With TSH     Continue all other maintenance medications.  Follow up plan: Return in about 3 months (around 05/22/2019) for ADHD.  Continue healthy lifestyle choices, including diet (rich in fruits, vegetables, and lean proteins, and low in salt and simple carbohydrates) and exercise (at least 30 minutes of moderate physical activity daily).  Educational handout given for DASH  The above assessment and management plan was discussed with the patient. The patient verbalized understanding of and has agreed to the management plan. Patient is aware to call the clinic if symptoms persist or worsen. Patient is aware when to return to the clinic for a follow-up visit. Patient educated on when it is appropriate to go to the emergency department.   Monia Pouch, FNP-C [facility] [phone]

## 2019-02-21 LAB — CMP14+EGFR
ALT: 23 IU/L (ref 0–32)
AST: 16 IU/L (ref 0–40)
Albumin/Globulin Ratio: 1.6 (ref 1.2–2.2)
Albumin: 4.2 g/dL (ref 3.9–5.0)
Alkaline Phosphatase: 66 IU/L (ref 39–117)
BUN/Creatinine Ratio: 24 — ABNORMAL HIGH (ref 9–23)
BUN: 14 mg/dL (ref 6–20)
Bilirubin Total: <0.2 mg/dL (ref 0.0–1.2)
CO2: 21 mmol/L (ref 20–29)
Calcium: 9.6 mg/dL (ref 8.7–10.2)
Chloride: 102 mmol/L (ref 96–106)
Creatinine, Ser: 0.58 mg/dL (ref 0.57–1.00)
GFR calc Af Amer: 145 mL/min/{1.73_m2} (ref >59)
GFR calc non Af Amer: 126 mL/min/{1.73_m2} (ref >59)
Globulin, Total: 2.6 g/dL (ref 1.5–4.5)
Glucose: 98 mg/dL (ref 65–99)
Potassium: 5 mmol/L (ref 3.5–5.2)
Sodium: 136 mmol/L (ref 134–144)
Total Protein: 6.8 g/dL (ref 6.0–8.5)

## 2019-02-21 LAB — LIPID PANEL
Chol/HDL Ratio: 3.3 ratio (ref 0.0–4.4)
Cholesterol, Total: 184 mg/dL (ref 100–199)
HDL: 55 mg/dL (ref >39)
LDL Chol Calc (NIH): 113 mg/dL — ABNORMAL HIGH (ref 0–99)
Triglycerides: 87 mg/dL (ref 0–149)
VLDL Cholesterol Cal: 16 mg/dL (ref 5–40)

## 2019-02-21 LAB — CBC WITH DIFFERENTIAL/PLATELET
Basophils Absolute: 0 10*3/uL (ref 0.0–0.2)
Basos: 0 %
EOS (ABSOLUTE): 0.1 10*3/uL (ref 0.0–0.4)
Eos: 1 %
Hematocrit: 39.6 % (ref 34.0–46.6)
Hemoglobin: 12.8 g/dL (ref 11.1–15.9)
Immature Grans (Abs): 0 10*3/uL (ref 0.0–0.1)
Immature Granulocytes: 0 %
Lymphocytes Absolute: 2.2 10*3/uL (ref 0.7–3.1)
Lymphs: 32 %
MCH: 29 pg (ref 26.6–33.0)
MCHC: 32.3 g/dL (ref 31.5–35.7)
MCV: 90 fL (ref 79–97)
Monocytes Absolute: 0.6 10*3/uL (ref 0.1–0.9)
Monocytes: 8 %
Neutrophils Absolute: 4.1 10*3/uL (ref 1.4–7.0)
Neutrophils: 59 %
Platelets: 327 10*3/uL (ref 150–450)
RBC: 4.41 x10E6/uL (ref 3.77–5.28)
RDW: 12.5 % (ref 11.7–15.4)
WBC: 7 10*3/uL (ref 3.4–10.8)

## 2019-02-21 LAB — THYROID PANEL WITH TSH
Free Thyroxine Index: 1.1 — ABNORMAL LOW (ref 1.2–4.9)
T3 Uptake Ratio: 19 % — ABNORMAL LOW (ref 24–39)
T4, Total: 5.7 ug/dL (ref 4.5–12.0)
TSH: 2.06 u[IU]/mL (ref 0.450–4.500)

## 2019-06-23 ENCOUNTER — Other Ambulatory Visit: Payer: Self-pay | Admitting: Family Medicine

## 2019-06-23 DIAGNOSIS — J301 Allergic rhinitis due to pollen: Secondary | ICD-10-CM

## 2019-08-15 ENCOUNTER — Other Ambulatory Visit: Payer: Self-pay

## 2019-08-18 ENCOUNTER — Other Ambulatory Visit: Payer: Self-pay

## 2019-08-18 ENCOUNTER — Other Ambulatory Visit: Payer: Self-pay | Admitting: Family Medicine

## 2019-08-18 ENCOUNTER — Encounter: Payer: Self-pay | Admitting: Family Medicine

## 2019-08-18 ENCOUNTER — Ambulatory Visit (INDEPENDENT_AMBULATORY_CARE_PROVIDER_SITE_OTHER): Payer: Medicaid Other | Admitting: Family Medicine

## 2019-08-18 VITALS — BP 117/75 | HR 97 | Temp 98.0°F | Resp 20 | Ht 66.0 in | Wt 204.0 lb

## 2019-08-18 DIAGNOSIS — F79 Unspecified intellectual disabilities: Secondary | ICD-10-CM | POA: Diagnosis not present

## 2019-08-18 DIAGNOSIS — F952 Tourette's disorder: Secondary | ICD-10-CM

## 2019-08-18 DIAGNOSIS — Z23 Encounter for immunization: Secondary | ICD-10-CM

## 2019-08-18 DIAGNOSIS — I10 Essential (primary) hypertension: Secondary | ICD-10-CM

## 2019-08-18 DIAGNOSIS — F902 Attention-deficit hyperactivity disorder, combined type: Secondary | ICD-10-CM | POA: Diagnosis not present

## 2019-08-18 NOTE — Progress Notes (Signed)
Subjective:  Patient ID: Tiffany Bates, female    DOB: Jun 22, 1990, 29 y.o.   MRN: [id number]  Patient Care Team: Chipper Herb, MD (Inactive) as PCP - General (Family Medicine)   Chief Complaint:  Medical Management of Chronic Issues (6 mo ) and Hypertension   HPI: Tiffany Bates is a 29 y.o. female presenting on 08/18/2019 for Medical Management of Chronic Issues (6 mo ) and Hypertension   1. Essential hypertension Compliant with medications. Does not exercise on a regular basis. No chest pain, headaches, leg swelling, palpitations, or visual changes.   2. Intellectual disability Has caregiver with her today. No new or worsening symptoms.   3. Gilles de la Tourette's syndrome 4. Attention deficit hyperactivity disorder, combined type Followed by psychiatry on a regular basis and feels she is doing well with current medication regimen. States she is tolerating medications well without associated side effects.      Relevant past medical, surgical, family, and social history reviewed and updated as indicated.  Allergies and medications reviewed and updated. Date reviewed: Chart in Epic.   Past Medical History:  Diagnosis Date  . ADHD (attention deficit hyperactivity disorder)   . Hypertension   . Intellectual disability   . Tourette syndrome     Past Surgical History:  Procedure Laterality Date  . TONSILLECTOMY AND ADENOIDECTOMY      Social History   Socioeconomic History  . Marital status: Single    Spouse name: Not on file  . Number of children: Not on file  . Years of education: Not on file  . Highest education level: Not on file  Occupational History  . Not on file  Tobacco Use  . Smoking status: Never Smoker  . Smokeless tobacco: Never Used  Substance and Sexual Activity  . Alcohol use: No  . Drug use: No  . Sexual activity: Not on file  Other Topics Concern  . Not on file  Social History Narrative  . Not on file   Social Determinants of Health     Financial Resource Strain:   . Difficulty of Paying Living Expenses: Not on file  Food Insecurity:   . Worried About Charity fundraiser in the Last Year: Not on file  . Ran Out of Food in the Last Year: Not on file  Transportation Needs:   . Lack of Transportation (Medical): Not on file  . Lack of Transportation (Non-Medical): Not on file  Physical Activity:   . Days of Exercise per Week: Not on file  . Minutes of Exercise per Session: Not on file  Stress:   . Feeling of Stress : Not on file  Social Connections:   . Frequency of Communication with Friends and Family: Not on file  . Frequency of Social Gatherings with Friends and Family: Not on file  . Attends Religious Services: Not on file  . Active Member of Clubs or Organizations: Not on file  . Attends Archivist Meetings: Not on file  . Marital Status: Not on file  Intimate Partner Violence:   . Fear of Current or Ex-Partner: Not on file  . Emotionally Abused: Not on file  . Physically Abused: Not on file  . Sexually Abused: Not on file    Outpatient Encounter Medications as of 08/18/2019  Medication Sig  . atomoxetine (STRATTERA) 80 MG capsule Take 1 capsule by mouth daily.  . fluticasone (FLONASE) 50 MCG/ACT nasal spray Place 1 spray into both nostrils daily.  Marland Kitchen  methylphenidate 54 MG PO CR tablet TAKE (1) TABLET DAILY IN THE MORNING.  Marland Kitchen norethindrone-ethinyl estradiol 1/35 (ORTHO-NOVUM, NORTREL,CYCLAFEM) tablet Take 1 tablet by mouth daily.  . Oxcarbazepine (TRILEPTAL) 300 MG tablet Take 900 mg by mouth 2 (two) times daily.  Marland Kitchen PARoxetine (PAXIL) 40 MG tablet Take 40 mg by mouth every morning.  . [DISCONTINUED] lisinopril (ZESTRIL) 10 MG tablet Take 1 tablet (10 mg total) by mouth daily.   No facility-administered encounter medications on file as of 08/18/2019.    No Known Allergies  Review of Systems  Constitutional: Negative for activity change, appetite change, chills, diaphoresis, fatigue, fever and  unexpected weight change.  HENT: Negative.   Eyes: Negative.  Negative for photophobia and visual disturbance.  Respiratory: Negative for cough, chest tightness and shortness of breath.   Cardiovascular: Negative for chest pain, palpitations and leg swelling.  Gastrointestinal: Negative for blood in stool, constipation, diarrhea, nausea and vomiting.  Endocrine: Negative.   Genitourinary: Negative for decreased urine volume, difficulty urinating, dysuria, frequency and urgency.  Musculoskeletal: Negative for arthralgias and myalgias.  Skin: Negative.   Allergic/Immunologic: Negative.   Neurological: Negative for dizziness, tremors, seizures, syncope, facial asymmetry, speech difficulty, weakness, light-headedness, numbness and headaches.  Hematological: Negative.   Psychiatric/Behavioral: Positive for behavioral problems, decreased concentration and sleep disturbance. Negative for agitation, confusion, dysphoric mood, hallucinations, self-injury and suicidal ideas. The patient is nervous/anxious and is hyperactive.   All other systems reviewed and are negative.       Objective:  BP 117/75   Pulse 97   Temp 98 F (36.7 C)   Resp 20   Ht '5\' 6"'  (1.676 m)   Wt 204 lb (92.5 kg)   SpO2 96%   BMI 32.93 kg/m    Wt Readings from Last 3 Encounters:  08/18/19 204 lb (92.5 kg)  02/20/19 190 lb (86.2 kg)  08/14/18 174 lb (78.9 kg)    Physical Exam Vitals and nursing note reviewed.  Constitutional:      General: She is not in acute distress.    Appearance: Normal appearance. She is well-developed and well-groomed. She is obese. She is not ill-appearing, toxic-appearing or diaphoretic.  HENT:     Head: Normocephalic and atraumatic.     Jaw: There is normal jaw occlusion.     Right Ear: Hearing normal.     Left Ear: Hearing normal.     Nose: Nose normal.     Mouth/Throat:     Lips: Pink.     Mouth: Mucous membranes are moist.     Pharynx: Oropharynx is clear. Uvula midline.  Eyes:      General: Lids are normal.     Extraocular Movements: Extraocular movements intact.     Conjunctiva/sclera: Conjunctivae normal.     Pupils: Pupils are equal, round, and reactive to light.  Neck:     Thyroid: No thyroid mass, thyromegaly or thyroid tenderness.     Vascular: No carotid bruit or JVD.     Trachea: Trachea and phonation normal.  Cardiovascular:     Rate and Rhythm: Normal rate and regular rhythm.     Chest Wall: PMI is not displaced.     Pulses: Normal pulses.     Heart sounds: Normal heart sounds. No murmur. No friction rub. No gallop.   Pulmonary:     Effort: Pulmonary effort is normal. No respiratory distress.     Breath sounds: Normal breath sounds. No wheezing.  Abdominal:     General: Bowel sounds are  normal. There is no distension or abdominal bruit.     Palpations: Abdomen is soft. There is no hepatomegaly or splenomegaly.     Tenderness: There is no abdominal tenderness. There is no right CVA tenderness or left CVA tenderness.     Hernia: No hernia is present.  Musculoskeletal:        General: Normal range of motion.     Cervical back: Normal range of motion and neck supple.     Right lower leg: No edema.     Left lower leg: No edema.  Lymphadenopathy:     Cervical: No cervical adenopathy.  Skin:    General: Skin is warm and dry.     Capillary Refill: Capillary refill takes less than 2 seconds.     Coloration: Skin is not cyanotic, jaundiced or pale.     Findings: No rash.  Neurological:     General: No focal deficit present.     Mental Status: She is alert and oriented to person, place, and time.     Cranial Nerves: Cranial nerves are intact. No cranial nerve deficit.     Sensory: Sensation is intact. No sensory deficit.     Motor: Motor function is intact. No weakness.     Coordination: Coordination is intact. Coordination normal.     Gait: Gait is intact. Gait normal.     Deep Tendon Reflexes: Reflexes are normal and symmetric. Reflexes normal.    Psychiatric:        Attention and Perception: Attention and perception normal.        Mood and Affect: Affect normal. Mood is anxious.        Speech: Speech is rapid and pressured.        Behavior: Behavior normal. Behavior is cooperative.        Thought Content: Thought content normal.        Cognition and Memory: Cognition and memory normal.        Judgment: Judgment normal.     Results for orders placed or performed in visit on 02/20/19  CMP14+EGFR  Result Value Ref Range   Glucose 98 65 - 99 mg/dL   BUN 14 6 - 20 mg/dL   Creatinine, Ser 0.58 0.57 - 1.00 mg/dL   GFR calc non Af Amer 126 >59 mL/min/1.73   GFR calc Af Amer 145 >59 mL/min/1.73   BUN/Creatinine Ratio 24 (H) 9 - 23   Sodium 136 134 - 144 mmol/L   Potassium 5.0 3.5 - 5.2 mmol/L   Chloride 102 96 - 106 mmol/L   CO2 21 20 - 29 mmol/L   Calcium 9.6 8.7 - 10.2 mg/dL   Total Protein 6.8 6.0 - 8.5 g/dL   Albumin 4.2 3.9 - 5.0 g/dL   Globulin, Total 2.6 1.5 - 4.5 g/dL   Albumin/Globulin Ratio 1.6 1.2 - 2.2   Bilirubin Total <0.2 0.0 - 1.2 mg/dL   Alkaline Phosphatase 66 39 - 117 IU/L   AST 16 0 - 40 IU/L   ALT 23 0 - 32 IU/L  CBC with Differential/Platelet  Result Value Ref Range   WBC 7.0 3.4 - 10.8 x10E3/uL   RBC 4.41 3.77 - 5.28 x10E6/uL   Hemoglobin 12.8 11.1 - 15.9 g/dL   Hematocrit 39.6 34.0 - 46.6 %   MCV 90 79 - 97 fL   MCH 29.0 26.6 - 33.0 pg   MCHC 32.3 31.5 - 35.7 g/dL   RDW 12.5 11.7 - 15.4 %   Platelets 327  150 - 450 x10E3/uL   Neutrophils 59 Not Estab. %   Lymphs 32 Not Estab. %   Monocytes 8 Not Estab. %   Eos 1 Not Estab. %   Basos 0 Not Estab. %   Neutrophils Absolute 4.1 1.4 - 7.0 x10E3/uL   Lymphocytes Absolute 2.2 0.7 - 3.1 x10E3/uL   Monocytes Absolute 0.6 0.1 - 0.9 x10E3/uL   EOS (ABSOLUTE) 0.1 0.0 - 0.4 x10E3/uL   Basophils Absolute 0.0 0.0 - 0.2 x10E3/uL   Immature Granulocytes 0 Not Estab. %   Immature Grans (Abs) 0.0 0.0 - 0.1 x10E3/uL  Lipid panel  Result Value Ref Range    Cholesterol, Total 184 100 - 199 mg/dL   Triglycerides 87 0 - 149 mg/dL   HDL 55 >39 mg/dL   VLDL Cholesterol Cal 16 5 - 40 mg/dL   LDL Chol Calc (NIH) 113 (H) 0 - 99 mg/dL   Chol/HDL Ratio 3.3 0.0 - 4.4 ratio  Thyroid Panel With TSH  Result Value Ref Range   TSH 2.060 0.450 - 4.500 uIU/mL   T4, Total 5.7 4.5 - 12.0 ug/dL   T3 Uptake Ratio 19 (L) 24 - 39 %   Free Thyroxine Index 1.1 (L) 1.2 - 4.9       Pertinent labs & imaging results that were available during my care of the patient were reviewed by me and considered in my medical decision making.  Assessment & Plan:  Tiffany Bates was seen today for medical management of chronic issues and hypertension.  Diagnoses and all orders for this visit:  Essential hypertension BP well controlled. Changes were not made in regimen today. Goal BP is 130/80. Pt aware to report any persistent high or low readings. DASH diet and exercise encouraged. Exercise at least 150 minutes per week and increase as tolerated. Goal BMI > 25. Stress management encouraged. Avoid nicotine and tobacco product use. Avoid excessive alcohol and NSAID's. Avoid more than 2000 mg of sodium daily. Medications as prescribed. Follow up as scheduled.   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   Tdap updated today   Continue all other maintenance medications.  Follow up plan: Return in about 6 months (around 02/18/2020), or if symptoms worsen or fail to improve, for HTN.  Continue healthy lifestyle choices, including diet (rich in fruits, vegetables, and lean proteins, and low in salt and simple carbohydrates) and exercise (at least 30 minutes of moderate physical activity daily).  Educational handout given for DASH diet  The above assessment and management plan was discussed with the patient. The patient  verbalized understanding of and has agreed to the management plan. Patient is aware to call the clinic if they develop any new symptoms or if symptoms persist or worsen. Patient is aware when to return to the clinic for a follow-up visit. Patient educated on when it is appropriate to go to the emergency department.   Monia Pouch, FNP-C [facility] [phone]

## 2019-08-18 NOTE — Patient Instructions (Signed)
DASH Eating Plan DASH stands for "Dietary Approaches to Stop Hypertension." The DASH eating plan is a healthy eating plan that has been shown to reduce high blood pressure (hypertension). Additional health benefits may include reducing the risk of type 2 diabetes mellitus, heart disease, and stroke. The DASH eating plan may also help with weight loss.  WHAT DO I NEED TO KNOW ABOUT THE DASH EATING PLAN? For the DASH eating plan, you will follow these general guidelines:  Choose foods with a percent daily value for sodium of less than 5% (as listed on the food label).  Use salt-free seasonings or herbs instead of table salt or sea salt.  Check with your health care provider or pharmacist before using salt substitutes.  Eat lower-sodium products, often labeled as "lower sodium" or "no salt added."  Eat fresh foods.  Eat more vegetables, fruits, and low-fat dairy products.  Choose whole grains. Look for the word "whole" as the first word in the ingredient list.  Choose fish and skinless chicken or turkey more often than red meat. Limit fish, poultry, and meat to 6 oz (170 g) each day.  Limit sweets, desserts, sugars, and sugary drinks.  Choose heart-healthy fats.  Limit cheese to 1 oz (28 g) per day.  Eat more home-cooked food and less restaurant, buffet, and fast food.  Limit fried foods.  Cook foods using methods other than frying.  Limit canned vegetables. If you do use them, rinse them well to decrease the sodium.  When eating at a restaurant, ask that your food be prepared with less salt, or no salt if possible.  WHAT FOODS CAN I EAT? Seek help from a dietitian for individual calorie needs.  Grains Whole grain or whole wheat bread. Brown rice. Whole grain or whole wheat pasta. Quinoa, bulgur, and whole grain cereals. Low-sodium cereals. Corn or whole wheat flour tortillas. Whole grain cornbread. Whole grain crackers. Low-sodium crackers.  Vegetables Fresh or frozen  vegetables (raw, steamed, roasted, or grilled). Low-sodium or reduced-sodium tomato and vegetable juices. Low-sodium or reduced-sodium tomato sauce and paste. Low-sodium or reduced-sodium canned vegetables.   Fruits All fresh, canned (in natural juice), or frozen fruits.  Meat and Other Protein Products Ground beef (85% or leaner), grass-fed beef, or beef trimmed of fat. Skinless chicken or turkey. Ground chicken or turkey. Pork trimmed of fat. All fish and seafood. Eggs. Dried beans, peas, or lentils. Unsalted nuts and seeds. Unsalted canned beans.  Dairy Low-fat dairy products, such as skim or 1% milk, 2% or reduced-fat cheeses, low-fat ricotta or cottage cheese, or plain low-fat yogurt. Low-sodium or reduced-sodium cheeses.  Fats and Oils Tub margarines without trans fats. Light or reduced-fat mayonnaise and salad dressings (reduced sodium). Avocado. Safflower, olive, or canola oils. Natural peanut or almond butter.  Other Unsalted popcorn and pretzels. The items listed above may not be a complete list of recommended foods or beverages. Contact your dietitian for more options.  WHAT FOODS ARE NOT RECOMMENDED?  Grains White bread. White pasta. White rice. Refined cornbread. Bagels and croissants. Crackers that contain trans fat.  Vegetables Creamed or fried vegetables. Vegetables in a cheese sauce. Regular canned vegetables. Regular canned tomato sauce and paste. Regular tomato and vegetable juices.  Fruits Dried fruits. Canned fruit in light or heavy syrup. Fruit juice.  Meat and Other Protein Products Fatty cuts of meat. Ribs, chicken wings, bacon, sausage, bologna, salami, chitterlings, fatback, hot dogs, bratwurst, and packaged luncheon meats. Salted nuts and seeds. Canned beans with salt.    Dairy Whole or 2% milk, cream, half-and-half, and cream cheese. Whole-fat or sweetened yogurt. Full-fat cheeses or blue cheese. Nondairy creamers and whipped toppings. Processed cheese,  cheese spreads, or cheese curds.  Condiments Onion and garlic salt, seasoned salt, table salt, and sea salt. Canned and packaged gravies. Worcestershire sauce. Tartar sauce. Barbecue sauce. Teriyaki sauce. Soy sauce, including reduced sodium. Steak sauce. Fish sauce. Oyster sauce. Cocktail sauce. Horseradish. Ketchup and mustard. Meat flavorings and tenderizers. Bouillon cubes. Hot sauce. Tabasco sauce. Marinades. Taco seasonings. Relishes.  Fats and Oils Butter, stick margarine, lard, shortening, ghee, and bacon fat. Coconut, palm kernel, or palm oils. Regular salad dressings.  Other Pickles and olives. Salted popcorn and pretzels.  The items listed above may not be a complete list of foods and beverages to avoid. Contact your dietitian for more information.  WHERE CAN I FIND MORE INFORMATION? National Heart, Lung, and [employer] Institute: www.nhlbi.nih.gov/health/health-topics/topics/dash/ Document Released: 05/25/2011 Document Revised: 10/20/2013 Document Reviewed: 04/09/2013 Exit[employer] Patient Information 2015 [employer]. This information is not intended to replace advice given to you by your health care provider. Make sure you discuss any questions you have with your health care provider.   I think that you would greatly benefit from seeing a nutritionist.  If you are interested, please call Dr Sykes at [phone] to schedule an appointment.   

## 2019-08-18 NOTE — Addendum Note (Signed)
Addended by: Magdalene River on: 08/18/2019 11:06 AM   Modules accepted: Orders

## 2019-10-03 ENCOUNTER — Encounter: Payer: Self-pay | Admitting: *Deleted

## 2019-11-07 ENCOUNTER — Other Ambulatory Visit: Payer: Self-pay | Admitting: Family Medicine

## 2019-11-07 DIAGNOSIS — I10 Essential (primary) hypertension: Secondary | ICD-10-CM

## 2019-12-01 ENCOUNTER — Other Ambulatory Visit: Payer: Self-pay | Admitting: Family Medicine

## 2019-12-01 DIAGNOSIS — J301 Allergic rhinitis due to pollen: Secondary | ICD-10-CM

## 2020-01-27 ENCOUNTER — Other Ambulatory Visit: Payer: Self-pay | Admitting: Family Medicine

## 2020-01-27 DIAGNOSIS — I10 Essential (primary) hypertension: Secondary | ICD-10-CM

## 2020-02-18 ENCOUNTER — Encounter: Payer: Self-pay | Admitting: Family Medicine

## 2020-02-18 ENCOUNTER — Ambulatory Visit: Payer: Medicaid Other | Admitting: Family Medicine

## 2020-02-18 ENCOUNTER — Other Ambulatory Visit: Payer: Self-pay

## 2020-02-18 VITALS — BP 136/89 | HR 110 | Temp 98.2°F | Ht 66.0 in | Wt 203.0 lb

## 2020-02-18 DIAGNOSIS — F952 Tourette's disorder: Secondary | ICD-10-CM

## 2020-02-18 DIAGNOSIS — I10 Essential (primary) hypertension: Secondary | ICD-10-CM

## 2020-02-18 DIAGNOSIS — Z23 Encounter for immunization: Secondary | ICD-10-CM

## 2020-02-18 DIAGNOSIS — Z79899 Other long term (current) drug therapy: Secondary | ICD-10-CM

## 2020-02-18 DIAGNOSIS — Z5181 Encounter for therapeutic drug level monitoring: Secondary | ICD-10-CM

## 2020-02-18 DIAGNOSIS — F902 Attention-deficit hyperactivity disorder, combined type: Secondary | ICD-10-CM

## 2020-02-18 DIAGNOSIS — E7800 Pure hypercholesterolemia, unspecified: Secondary | ICD-10-CM

## 2020-02-18 DIAGNOSIS — Z7689 Persons encountering health services in other specified circumstances: Secondary | ICD-10-CM

## 2020-02-18 DIAGNOSIS — F79 Unspecified intellectual disabilities: Secondary | ICD-10-CM

## 2020-02-18 MED ORDER — LISINOPRIL 10 MG PO TABS: 10.0000 mg | ORAL_TABLET | Freq: Every day | ORAL | 3 refills | Status: DC

## 2020-02-18 NOTE — Progress Notes (Signed)
° °Subjective: °CC: est care, HTN, ADHD, tic disorder °PCP: Gottschalk, Ashly M, DO °HPI:Tiffany Bates is a 28 y.o. female presenting to clinic today for: ° °1. HTN °Compliant with Lisinopril. No reports of CP, SOB, edema. ° °2. ADHD/ Tic disorder/ intellectual disability °Mother reports that she is under the care of Dr Kleinpeter.  She sees him once yearly.  She is stable on current medication regimen.  Continues to have tics but overall doing well.  Previous adverse side effect from Depakote. Patient is no longer on this. ° ° °ROS: Per HPI ° °No Known Allergies °Past Medical History:  °Diagnosis Date  °• ADHD (attention deficit hyperactivity disorder)   °• Hypertension   °• Intellectual disability   °• Tourette syndrome   ° ° °Current Outpatient Medications:  °•  atomoxetine (STRATTERA) 80 MG capsule, Take 1 capsule by mouth daily., Disp: , Rfl:  °•  fluticasone (FLONASE) 50 MCG/ACT nasal spray, Place 1 spray into both nostrils daily., Disp: 48 g, Rfl: 1 °•  lisinopril (ZESTRIL) 10 MG tablet, Take 1 tablet (10 mg total) by mouth daily., Disp: 90 tablet, Rfl: 0 °•  methylphenidate 54 MG PO CR tablet, TAKE (1) TABLET DAILY IN THE MORNING., Disp: , Rfl:  °•  norethindrone-ethinyl estradiol 1/35 (ORTHO-NOVUM, NORTREL,CYCLAFEM) tablet, Take 1 tablet by mouth daily., Disp: , Rfl:  °•  Oxcarbazepine (TRILEPTAL) 300 MG tablet, Take 900 mg by mouth 2 (two) times daily., Disp: , Rfl:  °•  PARoxetine (PAXIL) 40 MG tablet, Take 40 mg by mouth every morning., Disp: , Rfl:  °Social History  ° °Socioeconomic History  °• Marital status: Single  °  Spouse name: Not on file  °• Number of children: Not on file  °• Years of education: Not on file  °• Highest education level: Not on file  °Occupational History  °• Not on file  °Tobacco Use  °• Smoking status: Never Smoker  °• Smokeless tobacco: Never Used  °Vaping Use  °• Vaping Use: Never used  °Substance and Sexual Activity  °• Alcohol use: No  °• Drug use: No  °• Sexual activity:  Not on file  °Other Topics Concern  °• Not on file  °Social History Narrative  °• Not on file  ° °Social Determinants of Health  ° °Financial Resource Strain:   °• Difficulty of Paying Living Expenses: Not on file  °Food Insecurity:   °• Worried About Running Out of Food in the Last Year: Not on file  °• Ran Out of Food in the Last Year: Not on file  °Transportation Needs:   °• Lack of Transportation (Medical): Not on file  °• Lack of Transportation (Non-Medical): Not on file  °Physical Activity:   °• Days of Exercise per Week: Not on file  °• Minutes of Exercise per Session: Not on file  °Stress:   °• Feeling of Stress : Not on file  °Social Connections:   °• Frequency of Communication with Friends and Family: Not on file  °• Frequency of Social Gatherings with Friends and Family: Not on file  °• Attends Religious Services: Not on file  °• Active Member of Clubs or Organizations: Not on file  °• Attends Club or Organization Meetings: Not on file  °• Marital Status: Not on file  °Intimate Partner Violence:   °• Fear of Current or Ex-Partner: Not on file  °• Emotionally Abused: Not on file  °• Physically Abused: Not on file  °• Sexually Abused: Not on file  ° °  Family History  °Problem Relation Age of Onset  °• Heart disease Father   °• Hyperlipidemia Father   °• Hypertension Father   °• Diabetes Father   ° ° °Objective: °Office vital signs reviewed. °BP 136/89    Pulse (!) 110    Temp 98.2 °F (36.8 °C) (Temporal)    Ht 5' 6" (1.676 m)    Wt 203 lb (92.1 kg)    SpO2 99%    BMI 32.77 kg/m²  ° °Physical Examination:  °General: Awake, alert, well nourished, No acute distress °HEENT: Normal, sclera white °Cardio: regular rate and rhythm, S1S2 heard, no murmurs appreciated °Pulm: clear to auscultation bilaterally, no wheezes, rhonchi or rales; normal work of breathing on room air °Neuro: intermittent facial and phonic tics noted °Psych: patient is pleasant and interactive. She is conversive.  Eye contact good.    ° °Depression screen PHQ 2/9 02/18/2020 08/18/2019 02/20/2019  °Decreased Interest 0 0 0  °Down, Depressed, Hopeless 0 0 0  °PHQ - 2 Score 0 0 0  °Altered sleeping 0 - -  °Tired, decreased energy 0 - -  °Change in appetite 0 - -  °Feeling bad or failure about yourself  0 - -  °Trouble concentrating 0 - -  °Moving slowly or fidgety/restless 0 - -  °Suicidal thoughts 0 - -  °PHQ-9 Score 0 - -  ° °Assessment/ Plan: °28 y.o. female  ° °Essential hypertension - Plan: CMP14+EGFR, lisinopril (ZESTRIL) 10 MG tablet ° °Intellectual disability ° °Gilles de la Tourette's syndrome ° °Attention deficit hyperactivity disorder, combined type ° °Establishing care with new doctor, encounter for ° °Pure hypercholesterolemia - Plan: Lipid Panel, CMP14+EGFR, TSH ° °Medication monitoring encounter - Plan: CBC with Differential, VITAMIN D 25 Hydroxy (Vit-D Deficiency, Fractures), Folate ° °On antiepileptic therapy - Plan: CBC with Differential, VITAMIN D 25 Hydroxy (Vit-D Deficiency, Fractures), Folate ° °Need for vaccination ° °Technically treated with Trileptal for behavioral disorder not for seizure disorder but will nonetheless check for vitamin deficiency given use of medication.  She is followed closely by Dr. Kleinpeter who manages her ADHD and mood medications.  BP controlled.  Lisinopril renewed. Metabolic panel ordered.  First dose of Moderna vaccine administered.  She will follow up in 3 weeks for repeat. ° °Orders Placed This Encounter  °Procedures  °• Lipid Panel  °• CMP14+EGFR  °• CBC with Differential  °• TSH  °• VITAMIN D 25 Hydroxy (Vit-D Deficiency, Fractures)  °• Folate  ° °Meds ordered this encounter  °Medications  °• lisinopril (ZESTRIL) 10 MG tablet  °  Sig: Take 1 tablet (10 mg total) by mouth daily.  °  Dispense:  90 tablet  °  Refill:  3  ° ° ° °Ashly M Gottschalk, DO °[facility] °[phone] ° ° °

## 2020-02-18 NOTE — Progress Notes (Signed)
   Covid-19 Vaccination Clinic  Name:  Tiffany Bates    MRN: [id number] DOB: Mar 06, 1991  02/18/2020  Ms. Tiffany Bates was observed post Covid-19 immunization for 15 minutes without incident. She was provided with Vaccine Information Sheet and instruction to access the V-Safe system.   Ms. Tiffany Bates was instructed to call 911 with any severe reactions post vaccine: Marland Kitchen Difficulty breathing  . Swelling of face and throat  . A fast heartbeat  . A bad rash all over body  . Dizziness and weakness

## 2020-02-19 LAB — CBC WITH DIFFERENTIAL/PLATELET
Basophils Absolute: 0 10*3/uL (ref 0.0–0.2)
Basos: 0 %
EOS (ABSOLUTE): 0 10*3/uL (ref 0.0–0.4)
Eos: 1 %
Hematocrit: 41.2 % (ref 34.0–46.6)
Hemoglobin: 13.4 g/dL (ref 11.1–15.9)
Immature Grans (Abs): 0 10*3/uL (ref 0.0–0.1)
Immature Granulocytes: 0 %
Lymphocytes Absolute: 2.4 10*3/uL (ref 0.7–3.1)
Lymphs: 32 %
MCH: 28.3 pg (ref 26.6–33.0)
MCHC: 32.5 g/dL (ref 31.5–35.7)
MCV: 87 fL (ref 79–97)
Monocytes Absolute: 0.5 10*3/uL (ref 0.1–0.9)
Monocytes: 7 %
Neutrophils Absolute: 4.4 10*3/uL (ref 1.4–7.0)
Neutrophils: 60 %
Platelets: 342 10*3/uL (ref 150–450)
RBC: 4.74 x10E6/uL (ref 3.77–5.28)
RDW: 12.4 % (ref 11.7–15.4)
WBC: 7.3 10*3/uL (ref 3.4–10.8)

## 2020-02-19 LAB — CMP14+EGFR
ALT: 24 IU/L (ref 0–32)
AST: 17 IU/L (ref 0–40)
Albumin/Globulin Ratio: 1.5 (ref 1.2–2.2)
Albumin: 4.1 g/dL (ref 3.9–5.0)
Alkaline Phosphatase: 94 IU/L (ref 48–121)
BUN/Creatinine Ratio: 22 (ref 9–23)
BUN: 17 mg/dL (ref 6–20)
Bilirubin Total: <0.2 mg/dL (ref 0.0–1.2)
CO2: 21 mmol/L (ref 20–29)
Calcium: 9.6 mg/dL (ref 8.7–10.2)
Chloride: 103 mmol/L (ref 96–106)
Creatinine, Ser: 0.77 mg/dL (ref 0.57–1.00)
GFR calc Af Amer: 121 mL/min/{1.73_m2} (ref >59)
GFR calc non Af Amer: 105 mL/min/{1.73_m2} (ref >59)
Globulin, Total: 2.8 g/dL (ref 1.5–4.5)
Glucose: 97 mg/dL (ref 65–99)
Potassium: 5 mmol/L (ref 3.5–5.2)
Sodium: 139 mmol/L (ref 134–144)
Total Protein: 6.9 g/dL (ref 6.0–8.5)

## 2020-02-19 LAB — LIPID PANEL
Chol/HDL Ratio: 3.7 ratio (ref 0.0–4.4)
Cholesterol, Total: 203 mg/dL — ABNORMAL HIGH (ref 100–199)
HDL: 55 mg/dL (ref >39)
LDL Chol Calc (NIH): 135 mg/dL — ABNORMAL HIGH (ref 0–99)
Triglycerides: 71 mg/dL (ref 0–149)
VLDL Cholesterol Cal: 13 mg/dL (ref 5–40)

## 2020-02-19 LAB — TSH: TSH: 2.72 u[IU]/mL (ref 0.450–4.500)

## 2020-02-19 LAB — VITAMIN D 25 HYDROXY (VIT D DEFICIENCY, FRACTURES): Vit D, 25-Hydroxy: 66.1 ng/mL (ref 30.0–100.0)

## 2020-02-19 LAB — FOLATE: Folate: >20 ng/mL (ref >3.0)

## 2020-02-20 ENCOUNTER — Telehealth: Payer: Self-pay | Admitting: Family Medicine

## 2020-02-20 NOTE — Telephone Encounter (Signed)
Mother aware of lab results 9.1.2021

## 2020-07-06 ENCOUNTER — Other Ambulatory Visit: Payer: Self-pay | Admitting: Family

## 2020-07-06 DIAGNOSIS — J301 Allergic rhinitis due to pollen: Secondary | ICD-10-CM

## 2021-01-17 ENCOUNTER — Other Ambulatory Visit: Payer: Self-pay | Admitting: *Deleted

## 2021-01-17 MED ORDER — NORETHIN-ETH ESTRAD TRIPHASIC 0.5/0.75/1-35 MG-MCG PO TABS: ORAL_TABLET | ORAL | 0 refills | Status: DC

## 2021-01-24 ENCOUNTER — Encounter: Payer: Medicaid Other | Admitting: Family Medicine

## 2021-01-24 ENCOUNTER — Ambulatory Visit: Payer: Medicaid Other | Admitting: Family Medicine

## 2021-01-25 ENCOUNTER — Other Ambulatory Visit: Payer: Self-pay

## 2021-01-25 ENCOUNTER — Ambulatory Visit (INDEPENDENT_AMBULATORY_CARE_PROVIDER_SITE_OTHER): Payer: Medicaid Other | Admitting: Family Medicine

## 2021-01-25 ENCOUNTER — Encounter: Payer: Self-pay | Admitting: Family Medicine

## 2021-01-25 VITALS — BP 132/78 | HR 99 | Temp 98.1°F | Ht 66.0 in

## 2021-01-25 DIAGNOSIS — I10 Essential (primary) hypertension: Secondary | ICD-10-CM | POA: Diagnosis not present

## 2021-01-25 DIAGNOSIS — F411 Generalized anxiety disorder: Secondary | ICD-10-CM

## 2021-01-25 DIAGNOSIS — Z79899 Other long term (current) drug therapy: Secondary | ICD-10-CM

## 2021-01-25 DIAGNOSIS — F902 Attention-deficit hyperactivity disorder, combined type: Secondary | ICD-10-CM

## 2021-01-25 DIAGNOSIS — Z6832 Body mass index (BMI) 32.0-32.9, adult: Secondary | ICD-10-CM

## 2021-01-25 DIAGNOSIS — F79 Unspecified intellectual disabilities: Secondary | ICD-10-CM

## 2021-01-25 DIAGNOSIS — F422 Mixed obsessional thoughts and acts: Secondary | ICD-10-CM

## 2021-01-25 DIAGNOSIS — E669 Obesity, unspecified: Secondary | ICD-10-CM

## 2021-01-25 LAB — BAYER DCA HB A1C WAIVED: HB A1C (BAYER DCA - WAIVED): 5.6 % (ref <7.0)

## 2021-01-25 MED ORDER — NORETHIN-ETH ESTRAD TRIPHASIC 0.5/0.75/1-35 MG-MCG PO TABS: ORAL_TABLET | ORAL | 5 refills | Status: DC

## 2021-01-25 MED ORDER — TRILEPTAL 300 MG PO TABS: 900.0000 mg | ORAL_TABLET | Freq: Two times a day (BID) | ORAL | 3 refills | Status: DC

## 2021-01-25 MED ORDER — LISINOPRIL 10 MG PO TABS: 10.0000 mg | ORAL_TABLET | Freq: Every day | ORAL | 3 refills | Status: DC

## 2021-01-25 MED ORDER — METHYLPHENIDATE HCL ER 54 MG PO TB24: 1.0000 | ORAL_TABLET | Freq: Every morning | ORAL | 0 refills | Status: DC

## 2021-01-25 MED ORDER — OXCARBAZEPINE 300 MG PO TABS: 900.0000 mg | ORAL_TABLET | Freq: Two times a day (BID) | ORAL | 1 refills | Status: DC

## 2021-01-25 MED ORDER — HYDROXYZINE HCL 25 MG PO TABS: 25.0000 mg | ORAL_TABLET | Freq: Every day | ORAL | 3 refills | Status: DC

## 2021-01-25 MED ORDER — PAROXETINE HCL 40 MG PO TABS: 40.0000 mg | ORAL_TABLET | ORAL | 3 refills | Status: DC

## 2021-01-25 MED ORDER — ATOMOXETINE HCL 80 MG PO CAPS: 80.0000 mg | ORAL_CAPSULE | Freq: Every day | ORAL | 1 refills | Status: DC

## 2021-01-25 NOTE — Progress Notes (Signed)
Subjective: CC: Blood pressure, ADHD PCP: Janora Norlander, DO MVH:[id number] Scalia is a 30 y.o. female presenting to clinic today for:  1.  Hypertension Patient is compliant with lisinopril 10 mg daily.  Her father denies any reports of chest pain, shortness of breath.  2.  Intellectual delay, ADHD, mood disorder Patient has been compliant and stable on Strattera 80 mg daily, Concerta 54 mg daily, Atarax 25 mg nightly, Trileptal 900 mg twice daily (which is used for her mood) and Paxil 40 mg daily.  She last saw Dr. Corlis Leak in February, at which point he recommended that she continue to follow-up with her primary care provider only for ongoing medication renewal since she had aged out of pediatric care.  He reinforced that she has been stable on current regimen.   ROS: Per HPI  No Known Allergies Past Medical History:  Diagnosis Date   ADHD (attention deficit hyperactivity disorder)    Hypertension    Intellectual disability    Tourette syndrome     Current Outpatient Medications:    atomoxetine (STRATTERA) 80 MG capsule, Take 1 capsule by mouth daily., Disp: , Rfl:    fluticasone (FLONASE) 50 MCG/ACT nasal spray, Place 1 spray into both nostrils daily., Disp: 16 g, Rfl: 1   hydrOXYzine (ATARAX/VISTARIL) 25 MG tablet, Take 25 mg by mouth at bedtime., Disp: , Rfl:    lisinopril (ZESTRIL) 10 MG tablet, Take 1 tablet (10 mg total) by mouth daily., Disp: 90 tablet, Rfl: 3   Methylphenidate HCl ER 54 MG TB24, Take 1 tablet by mouth every morning., Disp: , Rfl:    norethindrone-ethinyl estradiol (CYCLAFEM) 0.5/0.75/1-35 MG-MCG tablet, TAKE ONE TABLET BY MOUTH ONCE DAILY ACTIVE PILLS ONLY NO PLACEBOS, Disp: 28 tablet, Rfl: 0   Oxcarbazepine (TRILEPTAL) 300 MG tablet, Take 900 mg by mouth 2 (two) times daily., Disp: , Rfl:    PARoxetine (PAXIL) 40 MG tablet, Take 40 mg by mouth every morning., Disp: , Rfl:  Social History   Socioeconomic History   Marital status: Single    Spouse  name: Not on file   Number of children: Not on file   Years of education: Not on file   Highest education level: Not on file  Occupational History   Not on file  Tobacco Use   Smoking status: Never   Smokeless tobacco: Never  Vaping Use   Vaping Use: Never used  Substance and Sexual Activity   Alcohol use: No   Drug use: No   Sexual activity: Not on file  Other Topics Concern   Not on file  Social History Narrative   Not on file   Social Determinants of Health   Financial Resource Strain: Not on file  Food Insecurity: Not on file  Transportation Needs: Not on file  Physical Activity: Not on file  Stress: Not on file  Social Connections: Not on file  Intimate Partner Violence: Not on file   Family History  Problem Relation Age of Onset   Heart disease Father    Hyperlipidemia Father    Hypertension Father    Diabetes Father     Objective: Office vital signs reviewed. BP 132/78   Pulse 99   Temp 98.1 F (36.7 C)   Ht '5\' 6"'  (1.676 m)   SpO2 97%   BMI 32.77 kg/m   Physical Examination:  General: Awake, alert, well-appearing female, No acute distress HEENT: Normal; sclera white.  Moist mucous membranes Cardio: regular rate and rhythm, S1S2 heard, no  murmurs appreciated Pulm: clear to auscultation bilaterally, no wheezes, rhonchi or rales; normal work of breathing on room air GI: soft, obese, non-tender, non-distended, bowel sounds present x4, no hepatomegaly, no splenomegaly, no masses Extremities: warm, well perfused, No edema, cyanosis or clubbing; +2 pulses bilaterally Psych: Speech somewhat repetitive.  Patient is very pleasant, interactive and seems interested in her health. Depression screen [facility] 2/9 01/25/2021 02/18/2020 08/18/2019  Decreased Interest 0 0 0  Down, Depressed, Hopeless 0 0 0  PHQ - 2 Score 0 0 0  Altered sleeping - 0 -  Tired, decreased energy - 0 -  Change in appetite - 0 -  Feeling bad or failure about yourself  - 0 -  Trouble concentrating  - 0 -  Moving slowly or fidgety/restless - 0 -  Suicidal thoughts - 0 -  PHQ-9 Score - 0 -   GAD 7 : Generalized Anxiety Score 01/25/2021  Nervous, Anxious, on Edge 0  Control/stop worrying 0  Worry too much - different things 0  Trouble relaxing 0  Restless 0  Easily annoyed or irritable 0  Afraid - awful might happen 0  Total GAD 7 Score 0  Anxiety Difficulty Not difficult at all   Assessment/ Plan: 30 y.o. female   Essential hypertension - Plan: lisinopril (ZESTRIL) 10 MG tablet, CMP14+EGFR, Lipid panel, Lipid panel, CMP14+EGFR  Attention deficit hyperactivity disorder (ADHD), combined type - Plan: atomoxetine (STRATTERA) 80 MG capsule, Drug Screen 10 W/Conf, Se, Methylphenidate HCl ER 54 MG TB24, Drug Screen 10 W/Conf, Se  Controlled substance agreement signed - Plan: Drug Screen 10 W/Conf, Se, Drug Screen 10 W/Conf, Se  Generalized anxiety disorder - Plan: atomoxetine (STRATTERA) 80 MG capsule, TRILEPTAL 300 MG tablet  Mixed obsessional thoughts and acts - Plan: atomoxetine (STRATTERA) 80 MG capsule, TRILEPTAL 300 MG tablet  Intellectual disability - Plan: atomoxetine (STRATTERA) 80 MG capsule  Class 1 obesity without serious comorbidity with body mass index (BMI) of 32.0 to 32.9 in adult, unspecified obesity type - Plan: Bayer DCA Hb A1c Waived, Bayer DCA Hb A1c Waived  Blood pressure is controlled.  Continue current regimen  Nonfasting labs were obtained today.  I reviewed Dr. Dahlia Byes last note earlier this year and he has asked that we take over her medications as she has been stable.  I have renewed these medicines.  Her father will complete a controlled substance contract for ADHD today.  Drug screen was collected with her other blood labs today as per office policy.  I have no reason to suspect abnormal use of her medication  A1c does not demonstrate diabetes  No orders of the defined types were placed in this encounter.  No orders of the defined types were  placed in this encounter.    Janora Norlander, DO [place] [phone]

## 2021-01-25 NOTE — Patient Instructions (Signed)

## 2021-01-31 LAB — CMP14+EGFR
ALT: 29 IU/L (ref 0–32)
AST: 25 IU/L (ref 0–40)
Albumin/Globulin Ratio: 1.5 (ref 1.2–2.2)
Albumin: 4.3 g/dL (ref 3.9–5.0)
Alkaline Phosphatase: 101 IU/L (ref 44–121)
BUN/Creatinine Ratio: 19 (ref 9–23)
BUN: 12 mg/dL (ref 6–20)
Bilirubin Total: <0.2 mg/dL (ref 0.0–1.2)
CO2: 17 mmol/L — ABNORMAL LOW (ref 20–29)
Calcium: 9.7 mg/dL (ref 8.7–10.2)
Chloride: 94 mmol/L — ABNORMAL LOW (ref 96–106)
Creatinine, Ser: 0.63 mg/dL (ref 0.57–1.00)
Globulin, Total: 2.9 g/dL (ref 1.5–4.5)
Glucose: 88 mg/dL (ref 65–99)
Potassium: 4.5 mmol/L (ref 3.5–5.2)
Sodium: 130 mmol/L — ABNORMAL LOW (ref 134–144)
Total Protein: 7.2 g/dL (ref 6.0–8.5)
eGFR: 122 mL/min/{1.73_m2} (ref >59)

## 2021-01-31 LAB — DRUG SCREEN 10 W/CONF, SERUM
Amphetamines, IA: NEGATIVE ng/mL
Barbiturates, IA: NEGATIVE ug/mL
Benzodiazepines, IA: NEGATIVE ng/mL
Cocaine & Metabolite, IA: NEGATIVE ng/mL
Methadone, IA: NEGATIVE ng/mL
Opiates, IA: NEGATIVE ng/mL
Oxycodones, IA: NEGATIVE ng/mL
Phencyclidine, IA: NEGATIVE ng/mL
Propoxyphene, IA: NEGATIVE ng/mL
THC(Marijuana) Metabolite, IA: NEGATIVE ng/mL

## 2021-01-31 LAB — LIPID PANEL
Chol/HDL Ratio: 3.1 ratio (ref 0.0–4.4)
Cholesterol, Total: 192 mg/dL (ref 100–199)
HDL: 61 mg/dL (ref >39)
LDL Chol Calc (NIH): 111 mg/dL — ABNORMAL HIGH (ref 0–99)
Triglycerides: 110 mg/dL (ref 0–149)
VLDL Cholesterol Cal: 20 mg/dL (ref 5–40)

## 2021-03-03 ENCOUNTER — Other Ambulatory Visit: Payer: Self-pay | Admitting: Family Medicine

## 2021-03-03 DIAGNOSIS — F902 Attention-deficit hyperactivity disorder, combined type: Secondary | ICD-10-CM

## 2021-03-03 MED ORDER — METHYLPHENIDATE HCL ER 54 MG PO TB24: 1.0000 | ORAL_TABLET | Freq: Every morning | ORAL | 0 refills | Status: DC

## 2021-03-03 MED ORDER — METHYLPHENIDATE HCL ER 54 MG PO TB24: 54.0000 mg | ORAL_TABLET | Freq: Every morning | ORAL | 0 refills | Status: DC

## 2021-03-03 NOTE — Progress Notes (Signed)
Initial Rx for #90 was not covered by insurance so I sent into prescription for #30 that are postdated.  She can follow-up as scheduled for interval checkup

## 2021-03-08 ENCOUNTER — Telehealth: Payer: Self-pay | Admitting: Family Medicine

## 2021-03-08 ENCOUNTER — Other Ambulatory Visit: Payer: Self-pay | Admitting: Family Medicine

## 2021-03-08 DIAGNOSIS — F902 Attention-deficit hyperactivity disorder, combined type: Secondary | ICD-10-CM

## 2021-03-08 MED ORDER — METHYLPHENIDATE HCL ER 54 MG PO TB24: 1.0000 | ORAL_TABLET | Freq: Every morning | ORAL | 0 refills | Status: DC

## 2021-03-08 MED ORDER — METHYLPHENIDATE HCL ER 54 MG PO TB24: 54.0000 mg | ORAL_TABLET | Freq: Every morning | ORAL | 0 refills | Status: DC

## 2021-03-08 NOTE — Telephone Encounter (Signed)
  Prescription Request  03/08/2021  Is this a "Controlled Substance" medicine? yes Have you seen your PCP in the last 2 weeks? no If YES, route message to pool  -  If NO, patient needs to be scheduled for appointment.  What is the name of the medication or equipment? Concerta Have you contacted your pharmacy to request a refill? yes  Which pharmacy would you like this sent to? [facility] on [address]   Patient notified that their request is being sent to the clinical staff for review and that they should receive a response within 2 business days.    Dr Delynn Flavin pt.  Mom called & she also works at Pharmacy that RX comes to. They sent RX over & have not heard back.  Last time RX was for 90 days & they had to change to 34 days? Bc of Insurance. Prior Auth?

## 2021-03-08 NOTE — Telephone Encounter (Signed)
Concerta Rxs in looking were sent to [facility], these need to go to [employer] where mom Tammy works. Please send Sept & October scripts

## 2021-03-08 NOTE — Telephone Encounter (Signed)
Done

## 2021-03-17 ENCOUNTER — Telehealth: Payer: Self-pay | Admitting: *Deleted

## 2021-03-17 DIAGNOSIS — F79 Unspecified intellectual disabilities: Secondary | ICD-10-CM

## 2021-03-17 NOTE — Telephone Encounter (Signed)
Confirmation #:[id number] WPrior Approval [id number] Status:APPROVED  PA for methylphenidate ER 54 - done on [place] TRracks   Pharm aware of approval

## 2021-04-26 ENCOUNTER — Other Ambulatory Visit: Payer: Self-pay | Admitting: Family Medicine

## 2021-04-26 DIAGNOSIS — I10 Essential (primary) hypertension: Secondary | ICD-10-CM

## 2021-05-24 ENCOUNTER — Telehealth: Payer: Self-pay | Admitting: *Deleted

## 2021-05-24 NOTE — Telephone Encounter (Signed)
Trileptal 300MG  tablets PA came in Filled out on [place] [address]     Generic trileptal is preferred - brand is not for MCD  Must have tried 2 of the following for MCD to cover name brand:   Aptiom Carbamazepine chew / er cap Oxtellar tegretol

## 2021-05-24 NOTE — Telephone Encounter (Signed)
This patient has intellectual disability and BRAND was recommended by her specialist.  Recommend proceeding with PA

## 2021-05-25 NOTE — Telephone Encounter (Signed)
PA started [place] TRACKS   Approval Status InquiryPrint this page  Page Zoom Help  HelpRequired Field indicates a required field Legendform legend Header Information Confirmation #:[id number] [facility] Plan:MCAIDHealth Plan:NCXIX Prior Approval #:PA Type:PHARMACYRecipient:Tiffany Bates BW:[id number] OBilling Provider:Billing Provider BU:[id number] Provider Name:[facility] INCRequesting Provider HO:[id number] Submission Date:12/07/2022Status:SUSPENDEDEffective Begin Date:Effective End Date:Payer:[place] [employer] DIV OF HEALTH BENEFITS# of Attachments:0  Pending

## 2021-05-27 ENCOUNTER — Ambulatory Visit (INDEPENDENT_AMBULATORY_CARE_PROVIDER_SITE_OTHER): Payer: Medicaid Other | Admitting: Family Medicine

## 2021-05-27 ENCOUNTER — Encounter: Payer: Self-pay | Admitting: Family Medicine

## 2021-05-27 VITALS — BP 125/82 | HR 108 | Temp 97.7°F | Ht 66.0 in | Wt 231.8 lb

## 2021-05-27 DIAGNOSIS — F411 Generalized anxiety disorder: Secondary | ICD-10-CM | POA: Diagnosis not present

## 2021-05-27 DIAGNOSIS — F79 Unspecified intellectual disabilities: Secondary | ICD-10-CM | POA: Diagnosis not present

## 2021-05-27 DIAGNOSIS — F902 Attention-deficit hyperactivity disorder, combined type: Secondary | ICD-10-CM

## 2021-05-27 DIAGNOSIS — F422 Mixed obsessional thoughts and acts: Secondary | ICD-10-CM

## 2021-05-27 DIAGNOSIS — Z23 Encounter for immunization: Secondary | ICD-10-CM

## 2021-05-27 MED ORDER — METHYLPHENIDATE HCL ER 54 MG PO TB24: 1.0000 | ORAL_TABLET | Freq: Every morning | ORAL | 0 refills | Status: DC

## 2021-05-27 MED ORDER — ATOMOXETINE HCL 80 MG PO CAPS: 80.0000 mg | ORAL_CAPSULE | Freq: Every day | ORAL | 1 refills | Status: DC

## 2021-05-27 NOTE — Progress Notes (Signed)
Subjective: CC: ADHD PCP: Raliegh Ip, DO [id number] [name] is a 30 y.o. female presenting to clinic today for:  1.  ADHD/Tourette's syndrome Patient is compliant with her medications.  Mood has been stable per her father's report.  No reports of change in appetite.  Her Trileptal was covered thankfully.  Her mother voices some concern about ability to get Tiffany Bates to appointments, as Tiffany Bates is not able to drive herself to appointments and really relies on her parents and/or caretaker to get her here.  Her previous provider only required once yearly visits and refilled her medications throughout the rest of the year.   ROS: Per HPI  No Known Allergies Past Medical History:  Diagnosis Date   ADHD (attention deficit hyperactivity disorder)    Hypertension    Intellectual disability    Tourette syndrome     Current Outpatient Medications:    atomoxetine (STRATTERA) 80 MG capsule, Take 1 capsule (80 mg total) by mouth daily., Disp: 90 capsule, Rfl: 1   fluticasone (FLONASE) 50 MCG/ACT nasal spray, Place 1 spray into both nostrils daily., Disp: 16 g, Rfl: 1   hydrOXYzine (ATARAX/VISTARIL) 25 MG tablet, Take 1 tablet (25 mg total) by mouth at bedtime., Disp: 90 tablet, Rfl: 3   lisinopril (ZESTRIL) 10 MG tablet, TAKE ONE TABLET BY MOUTH DAILY, Disp: 90 tablet, Rfl: 1   Methylphenidate HCl ER 54 MG TB24, Take 1 tablet by mouth every morning., Disp: 30 tablet, Rfl: 0   Methylphenidate HCl ER 54 MG TB24, Take 54 mg by mouth in the morning., Disp: 30 tablet, Rfl: 0   norethindrone-ethinyl estradiol (CYCLAFEM) 0.5/0.75/1-35 MG-MCG tablet, TAKE ONE TABLET BY MOUTH ONCE DAILY ACTIVE PILLS ONLY NO PLACEBOS, Disp: 84 tablet, Rfl: 5   PARoxetine (PAXIL) 40 MG tablet, Take 1 tablet (40 mg total) by mouth every morning., Disp: 90 tablet, Rfl: 3   TRILEPTAL 300 MG tablet, Take 3 tablets (900 mg total) by mouth 2 (two) times daily., Disp: 180 tablet, Rfl: 3 Social History   Socioeconomic  History   Marital status: Single    Spouse name: Not on file   Number of children: Not on file   Years of education: Not on file   Highest education level: Not on file  Occupational History   Not on file  Tobacco Use   Smoking status: Never   Smokeless tobacco: Never  Vaping Use   Vaping Use: Never used  Substance and Sexual Activity   Alcohol use: No   Drug use: No   Sexual activity: Not on file  Other Topics Concern   Not on file  Social History Narrative   Not on file   Social Determinants of Health   Financial Resource Strain: Not on file  Food Insecurity: Not on file  Transportation Needs: Not on file  Physical Activity: Not on file  Stress: Not on file  Social Connections: Not on file  Intimate Partner Violence: Not on file   Family History  Problem Relation Age of Onset   Heart disease Father    Hyperlipidemia Father    Hypertension Father    Diabetes Father     Objective: Office vital signs reviewed. BP 125/82   Pulse (!) 108   Temp 97.7 F (36.5 C)   Ht 5\' 6"  (1.676 m)   Wt 231 lb 12.8 oz (105.1 kg)   SpO2 100%   BMI 37.41 kg/m   Physical Examination:  General: Awake, alert, well nourished, No acute distress HEENT:  Normal, sclera white Cardio: regular rate and rhythm, S1S2 heard, no murmurs appreciated Pulm: clear to auscultation bilaterally, no wheezes, rhonchi or rales; normal work of breathing on room air Psych: Mood is stable.  Patient is pleasant.  Assessment/ Plan: 30 y.o. female   Attention deficit hyperactivity disorder (ADHD), combined type - Plan: Methylphenidate HCl ER 54 MG TB24, Methylphenidate HCl ER 54 MG TB24, Methylphenidate HCl ER 54 MG TB24, atomoxetine (STRATTERA) 80 MG capsule  Intellectual disability - Plan: atomoxetine (STRATTERA) 80 MG capsule  Generalized anxiety disorder - Plan: atomoxetine (STRATTERA) 80 MG capsule  Mixed obsessional thoughts and acts - Plan: atomoxetine (STRATTERA) 80 MG capsule  Need for  immunization against influenza - Plan: Flu Vaccine QUAD 6mo+IM (Fluarix, Fluzone & Alfiuria Quad PF)  I renewed all the medications it looks like they might be coming up for refill.  I have postdated 3 months worth of Concerta.  Whilst it is true this is C2 medication can be renewed without a face-to-face visit legally our clinic's policy is every 3 months face-to-face for all controlled substances that are class II medications.  I am glad to accommodate her with video visit for our next checkup since she has been stable on this medication and is up-to-date on controlled substance contract and urine drug screens, which are also part of our office policy.  Her father will contact me shortly prior to her appointment in February and will call us with the number to contact.  The Narcotic Database has been reviewed.  There were no red flags.    Influenza vaccination administered  No orders of the defined types were placed in this encounter.  No orders of the defined types were placed in this encounter.    Raliegh Ip, DO [facility] [phone]

## 2021-05-31 NOTE — Telephone Encounter (Signed)
Was approved per mother

## 2021-07-05 ENCOUNTER — Other Ambulatory Visit: Payer: Self-pay | Admitting: *Deleted

## 2021-07-05 DIAGNOSIS — F411 Generalized anxiety disorder: Secondary | ICD-10-CM

## 2021-07-05 DIAGNOSIS — F422 Mixed obsessional thoughts and acts: Secondary | ICD-10-CM

## 2021-07-05 MED ORDER — TRILEPTAL 300 MG PO TABS: 900.0000 mg | ORAL_TABLET | Freq: Two times a day (BID) | ORAL | 12 refills | Status: DC

## 2021-07-05 NOTE — Telephone Encounter (Signed)
Fax from [employer] RF request on Trileptal 300 mg w/ note needing DAW1, pt can only take brand name

## 2021-08-01 ENCOUNTER — Encounter: Payer: Self-pay | Admitting: Family Medicine

## 2021-08-01 ENCOUNTER — Ambulatory Visit (INDEPENDENT_AMBULATORY_CARE_PROVIDER_SITE_OTHER): Payer: Medicaid Other | Admitting: Family Medicine

## 2021-08-01 VITALS — BP 127/87 | HR 76 | Temp 97.8°F | Ht 66.0 in | Wt 233.4 lb

## 2021-08-01 DIAGNOSIS — F902 Attention-deficit hyperactivity disorder, combined type: Secondary | ICD-10-CM | POA: Diagnosis not present

## 2021-08-01 DIAGNOSIS — F79 Unspecified intellectual disabilities: Secondary | ICD-10-CM

## 2021-08-01 DIAGNOSIS — F422 Mixed obsessional thoughts and acts: Secondary | ICD-10-CM | POA: Diagnosis not present

## 2021-08-01 DIAGNOSIS — M546 Pain in thoracic spine: Secondary | ICD-10-CM

## 2021-08-01 DIAGNOSIS — F411 Generalized anxiety disorder: Secondary | ICD-10-CM

## 2021-08-01 DIAGNOSIS — I10 Essential (primary) hypertension: Secondary | ICD-10-CM

## 2021-08-01 MED ORDER — METHYLPHENIDATE HCL ER 54 MG PO TB24: 1.0000 | ORAL_TABLET | Freq: Every morning | ORAL | 0 refills | Status: DC

## 2021-08-01 MED ORDER — LISINOPRIL 10 MG PO TABS: 10.0000 mg | ORAL_TABLET | Freq: Every day | ORAL | 3 refills | Status: DC

## 2021-08-01 MED ORDER — ATOMOXETINE HCL 80 MG PO CAPS: 80.0000 mg | ORAL_CAPSULE | Freq: Every day | ORAL | 1 refills | Status: DC

## 2021-08-01 NOTE — Progress Notes (Signed)
Subjective: CC: ADHD PCP: Janora Norlander, DO [id number] Tiffany Bates is a 31 y.o. female presenting to clinic today for:  1.  ADHD follow-up Patient is compliant with Strattera, Concerta.  Needs refills.  2.  Back pain Patient reports that she has intermittent back pain.  Her father thinks this is because of the way that she sits when she is on her videogames.  He has tried encouraging her to get up and moving but he notes that this is sometimes a challenge when he is not in the home.  She denies any dysuria, hematuria.  3.  Hypertension Compliant with lisinopril.  No reports of chest pain, shortness of breath or headache   ROS: Per HPI  No Known Allergies Past Medical History:  Diagnosis Date   ADHD (attention deficit hyperactivity disorder)    Hypertension    Intellectual disability    Tourette syndrome     Current Outpatient Medications:    atomoxetine (STRATTERA) 80 MG capsule, Take 1 capsule (80 mg total) by mouth daily., Disp: 90 capsule, Rfl: 1   fluticasone (FLONASE) 50 MCG/ACT nasal spray, Place 1 spray into both nostrils daily., Disp: 16 g, Rfl: 1   hydrOXYzine (ATARAX/VISTARIL) 25 MG tablet, Take 1 tablet (25 mg total) by mouth at bedtime., Disp: 90 tablet, Rfl: 3   lisinopril (ZESTRIL) 10 MG tablet, TAKE ONE TABLET BY MOUTH DAILY, Disp: 90 tablet, Rfl: 1   Methylphenidate HCl ER 54 MG TB24, Take 1 tablet by mouth every morning., Disp: 30 tablet, Rfl: 0   Methylphenidate HCl ER 54 MG TB24, Take 1 tablet by mouth in the morning., Disp: 30 tablet, Rfl: 0   Methylphenidate HCl ER 54 MG TB24, Take 1 tablet by mouth in the morning., Disp: 30 tablet, Rfl: 0   norethindrone-ethinyl estradiol (CYCLAFEM) 0.5/0.75/1-35 MG-MCG tablet, TAKE ONE TABLET BY MOUTH ONCE DAILY ACTIVE PILLS ONLY NO PLACEBOS, Disp: 84 tablet, Rfl: 5   PARoxetine (PAXIL) 40 MG tablet, Take 1 tablet (40 mg total) by mouth every morning., Disp: 90 tablet, Rfl: 3   TRILEPTAL 300 MG tablet, Take 3 tablets  (900 mg total) by mouth 2 (two) times daily., Disp: 180 tablet, Rfl: 12 Social History   Socioeconomic History   Marital status: Single    Spouse name: Not on file   Number of children: Not on file   Years of education: Not on file   Highest education level: Not on file  Occupational History   Not on file  Tobacco Use   Smoking status: Never   Smokeless tobacco: Never  Vaping Use   Vaping Use: Never used  Substance and Sexual Activity   Alcohol use: No   Drug use: No   Sexual activity: Not on file  Other Topics Concern   Not on file  Social History Narrative   Not on file   Social Determinants of Health   Financial Resource Strain: Not on file  Food Insecurity: Not on file  Transportation Needs: Not on file  Physical Activity: Not on file  Stress: Not on file  Social Connections: Not on file  Intimate Partner Violence: Not on file   Family History  Problem Relation Age of Onset   Heart disease Father    Hyperlipidemia Father    Hypertension Father    Diabetes Father     Objective: Office vital signs reviewed. BP 127/87    Pulse 76    Temp 97.8 F (36.6 C)    Ht 5\' 6"  (1.676  m)    Wt 233 lb 6.4 oz (105.9 kg)    SpO2 100%    BMI 37.67 kg/m   Physical Examination:  General: Awake, alert, well nourished, No acute distress HEENT: Sclera white Cardio: regular rate and rhythm, S1S2 heard, no murmurs appreciated Pulm: clear to auscultation bilaterally, no wheezes, rhonchi or rales; normal work of breathing on room air Psych: Mood stable MSK: Ambulating independently.  No tenderness to palpation to the ribs, thoracic's.  Assessment/ Plan: 31 y.o. female   Attention deficit hyperactivity disorder (ADHD), combined type - Plan: Methylphenidate HCl ER 54 MG TB24, Methylphenidate HCl ER 54 MG TB24, Methylphenidate HCl ER 54 MG TB24, atomoxetine (STRATTERA) 80 MG capsule  Intellectual disability - Plan: atomoxetine (STRATTERA) 80 MG capsule  Generalized anxiety  disorder - Plan: atomoxetine (STRATTERA) 80 MG capsule  Mixed obsessional thoughts and acts - Plan: atomoxetine (STRATTERA) 80 MG capsule  Essential hypertension - Plan: lisinopril (ZESTRIL) 10 MG tablet  Acute bilateral thoracic back pain   ADHD is stable.  Strattera and Concerta renewed.  National narcotic database reviewed and there were no red flags.  UDS and CSA are up-to-date and appropriate.  Okay to schedule video visit for next med refill if she desires  Blood pressure initially uncontrolled but upon recheck it was within normal range.  Continue current regimen with lisinopril  For her back pain, suspect that this is muscle spasm in nature due to poor posture and being on her electronic devices for a while.  She had no tenderness palpation reproducible on exam.  Normal work of breathing on exam.  No symptoms to suggest renal stone or urinary tract infection.  Advised to use Salonpas patches and/or muscle rub of choice.  Recommended getting up and moving at least every hour and a half to reduce strain on back.  No orders of the defined types were placed in this encounter.  No orders of the defined types were placed in this encounter.    Janora Norlander, DO [place] [phone]

## 2021-12-02 ENCOUNTER — Other Ambulatory Visit: Payer: Self-pay | Admitting: Family Medicine

## 2021-12-02 DIAGNOSIS — F902 Attention-deficit hyperactivity disorder, combined type: Secondary | ICD-10-CM

## 2021-12-05 NOTE — Telephone Encounter (Signed)
Ok to put her on a video visit and I can send in today.

## 2021-12-05 NOTE — Telephone Encounter (Signed)
APPT with [facility] is 01/10/22.  Appt note for that day says: 3 month mandatory visit.will need concerta rx for this month prior to visit please  Please advise refill

## 2021-12-06 ENCOUNTER — Encounter: Payer: Self-pay | Admitting: Family Medicine

## 2021-12-06 ENCOUNTER — Telehealth (INDEPENDENT_AMBULATORY_CARE_PROVIDER_SITE_OTHER): Payer: Medicaid Other | Admitting: Family Medicine

## 2021-12-06 DIAGNOSIS — F5101 Primary insomnia: Secondary | ICD-10-CM

## 2021-12-06 DIAGNOSIS — F902 Attention-deficit hyperactivity disorder, combined type: Secondary | ICD-10-CM

## 2021-12-06 MED ORDER — HYDROXYZINE HCL 50 MG PO TABS: 50.0000 mg | ORAL_TABLET | Freq: Every day | ORAL | 3 refills | Status: DC

## 2021-12-06 MED ORDER — METHYLPHENIDATE HCL ER 54 MG PO TB24: 1.0000 | ORAL_TABLET | Freq: Every morning | ORAL | 0 refills | Status: DC

## 2021-12-06 NOTE — Progress Notes (Signed)
MyChart Video visit  Subjective: CC: Follow-up ADHD PCP: Raliegh Ip, DO [id number] Tiffany Bates is a 31 y.o. female. Patient provides verbal consent for consult held via video.  Due to COVID-19 pandemic this visit was conducted virtually. This visit type was conducted due to national recommendations for restrictions regarding the COVID-19 Pandemic (e.g. social distancing, sheltering in place) in an effort to limit this patient's exposure and mitigate transmission in our community. All issues noted in this document were discussed and addressed.  A physical exam was not performed with this format.   Location of patient: Home Location of provider: [facility] Others present for call: Mother  1.  ADHD/insomnia Both the patient and mother report that ADHD is stable with methylphenidate ER 54 mg daily.  No adverse side effects reported but she does report that chronic insomnia is not really controlled with the hydroxyzine 25 mg.  She is asking that the dose be raised to 50 mg as she tosses and turns quite a bit such that she wore her bed out and it had to be replaced recently.   ROS: Per HPI  No Known Allergies Past Medical History:  Diagnosis Date   ADHD (attention deficit hyperactivity disorder)    Hypertension    Intellectual disability    Tourette syndrome     Current Outpatient Medications:    atomoxetine (STRATTERA) 80 MG capsule, Take 1 capsule (80 mg total) by mouth daily., Disp: 90 capsule, Rfl: 1   fluticasone (FLONASE) 50 MCG/ACT nasal spray, Place 1 spray into both nostrils daily., Disp: 16 g, Rfl: 1   hydrOXYzine (ATARAX/VISTARIL) 25 MG tablet, Take 1 tablet (25 mg total) by mouth at bedtime., Disp: 90 tablet, Rfl: 3   lisinopril (ZESTRIL) 10 MG tablet, Take 1 tablet (10 mg total) by mouth daily., Disp: 90 tablet, Rfl: 3   Methylphenidate HCl ER 54 MG TB24, Take 1 tablet by mouth every morning., Disp: 30 tablet, Rfl: 0   Methylphenidate HCl ER 54 MG TB24, Take 1 tablet by mouth  in the morning., Disp: 30 tablet, Rfl: 0   Methylphenidate HCl ER 54 MG TB24, Take 1 tablet by mouth in the morning., Disp: 30 tablet, Rfl: 0   norethindrone-ethinyl estradiol (CYCLAFEM) 0.5/0.75/1-35 MG-MCG tablet, TAKE ONE TABLET BY MOUTH ONCE DAILY ACTIVE PILLS ONLY NO PLACEBOS, Disp: 84 tablet, Rfl: 5   PARoxetine (PAXIL) 40 MG tablet, Take 1 tablet (40 mg total) by mouth every morning., Disp: 90 tablet, Rfl: 3   TRILEPTAL 300 MG tablet, Take 3 tablets (900 mg total) by mouth 2 (two) times daily., Disp: 180 tablet, Rfl: 12  Gen: Well-appearing female in no acute distress  Assessment/ Plan: 31 y.o. female   Primary insomnia - Plan: hydrOXYzine (ATARAX) 50 MG tablet  Attention deficit hyperactivity disorder (ADHD), combined type - Plan: Methylphenidate HCl ER 54 MG TB24, Methylphenidate HCl ER 54 MG TB24, Methylphenidate HCl ER 54 MG TB24  Atarax increased to 50 mg nightly as needed  ADHD meds have been renewed.  She is up-to-date on UDS and CSC.  National narcotic database reviewed and there were no red flags.  3 months postdated scripts prescribed and we will see her in September.  Mother to call and schedule  Start time: 7:45am End time: 7:50a  Total time spent on patient care (including video visit/ documentation): 5 minutes  Tiffany Hypes Hulen Skains, DO [facility] [phone]

## 2022-01-05 ENCOUNTER — Encounter: Payer: Self-pay | Admitting: Family Medicine

## 2022-01-09 ENCOUNTER — Other Ambulatory Visit: Payer: Self-pay | Admitting: Family Medicine

## 2022-01-09 DIAGNOSIS — I10 Essential (primary) hypertension: Secondary | ICD-10-CM

## 2022-01-09 MED ORDER — AMLODIPINE BESYLATE 2.5 MG PO TABS: 2.5000 mg | ORAL_TABLET | Freq: Every day | ORAL | 3 refills | Status: DC

## 2022-01-10 ENCOUNTER — Ambulatory Visit: Payer: Medicaid Other | Admitting: Family Medicine

## 2022-02-14 ENCOUNTER — Other Ambulatory Visit: Payer: Self-pay | Admitting: Family Medicine

## 2022-02-27 ENCOUNTER — Encounter: Payer: Self-pay | Admitting: Family Medicine

## 2022-02-28 ENCOUNTER — Ambulatory Visit (INDEPENDENT_AMBULATORY_CARE_PROVIDER_SITE_OTHER): Payer: Medicaid Other | Admitting: Family Medicine

## 2022-02-28 ENCOUNTER — Encounter: Payer: Self-pay | Admitting: Family Medicine

## 2022-02-28 VITALS — BP 132/83 | HR 104 | Temp 97.0°F | Ht 66.0 in | Wt 212.8 lb

## 2022-02-28 DIAGNOSIS — F411 Generalized anxiety disorder: Secondary | ICD-10-CM

## 2022-02-28 DIAGNOSIS — Z79899 Other long term (current) drug therapy: Secondary | ICD-10-CM

## 2022-02-28 DIAGNOSIS — R0609 Other forms of dyspnea: Secondary | ICD-10-CM

## 2022-02-28 DIAGNOSIS — I10 Essential (primary) hypertension: Secondary | ICD-10-CM

## 2022-02-28 DIAGNOSIS — F902 Attention-deficit hyperactivity disorder, combined type: Secondary | ICD-10-CM

## 2022-02-28 DIAGNOSIS — E669 Obesity, unspecified: Secondary | ICD-10-CM

## 2022-02-28 DIAGNOSIS — F5101 Primary insomnia: Secondary | ICD-10-CM

## 2022-02-28 DIAGNOSIS — F79 Unspecified intellectual disabilities: Secondary | ICD-10-CM

## 2022-02-28 DIAGNOSIS — F422 Mixed obsessional thoughts and acts: Secondary | ICD-10-CM

## 2022-02-28 LAB — BAYER DCA HB A1C WAIVED: HB A1C (BAYER DCA - WAIVED): 5.7 % — ABNORMAL HIGH (ref 4.8–5.6)

## 2022-02-28 MED ORDER — METHYLPHENIDATE HCL ER 54 MG PO TB24: 1.0000 | ORAL_TABLET | Freq: Every morning | ORAL | 0 refills | Status: DC

## 2022-02-28 MED ORDER — ATOMOXETINE HCL 80 MG PO CAPS: 80.0000 mg | ORAL_CAPSULE | Freq: Every day | ORAL | 3 refills | Status: DC

## 2022-02-28 MED ORDER — ALBUTEROL SULFATE HFA 108 (90 BASE) MCG/ACT IN AERS: 2.0000 | INHALATION_SPRAY | Freq: Four times a day (QID) | RESPIRATORY_TRACT | 0 refills | Status: AC | PRN

## 2022-02-28 MED ORDER — TRILEPTAL 300 MG PO TABS: 900.0000 mg | ORAL_TABLET | Freq: Two times a day (BID) | ORAL | 12 refills | Status: DC

## 2022-02-28 MED ORDER — PAROXETINE HCL 40 MG PO TABS: 40.0000 mg | ORAL_TABLET | ORAL | 3 refills | Status: DC

## 2022-02-28 NOTE — Patient Instructions (Signed)

## 2022-02-28 NOTE — Progress Notes (Signed)
Subjective: CC: Follow-up ADHD, shortness of breath PCP: Janora Norlander, DO Tiffany Bates is a 31 y.o. female presenting to clinic today for:  1.  ADHD, anxiety Patient is compliant with medications.  Needs refills.  The Atarax 50 mg nightly is working well to help with sleep.  She would like to continue this.  Mother thinks that there should be a prior authorization for Trileptal being sent to [place] and would like [place] to check in on that.  2.  Shortness of breath Patient reports shortness of breath after activity.  Her parents think that this may be related to physical deconditioning in the setting of obesity and or hyperventilating.  She is brought to the office by Juliann Pulse today who notes that she does seem to have a blue hue to her mouth when she tries to exert herself.  She has subsequently decreased the patient's physical activity from several laps adding up to a mile to only 1 lap.  She has checked her pulse ox after ambulation and it has gone down to the 80s occasionally with heart rate into the 130s and 140s when she exercises.  The patient reports coughing spells when this occurs but these all subside when she is able to sit and rest for a while.  Sometimes she will get rib pain, she points to the right side of her ribs when this occurs.  There is a family history of asthma.   ROS: Per HPI  No Known Allergies Past Medical History:  Diagnosis Date   ADHD (attention deficit hyperactivity disorder)    Hypertension    Intellectual disability    Tourette syndrome     Current Outpatient Medications:    amLODipine (NORVASC) 2.5 MG tablet, Take 1 tablet (2.5 mg total) by mouth daily. For high blood pressure. HOLD lisinopril, Disp: 90 tablet, Rfl: 3   atomoxetine (STRATTERA) 80 MG capsule, Take 1 capsule (80 mg total) by mouth daily., Disp: 90 capsule, Rfl: 1   fluticasone (FLONASE) 50 MCG/ACT nasal spray, Place 1 spray into both nostrils daily., Disp: 16 g, Rfl: 1   hydrOXYzine  (ATARAX) 50 MG tablet, Take 1 tablet (50 mg total) by mouth at bedtime., Disp: 90 tablet, Rfl: 3   lisinopril (ZESTRIL) 10 MG tablet, Take 1 tablet (10 mg total) by mouth daily., Disp: 90 tablet, Rfl: 3   Methylphenidate HCl ER 54 MG TB24, Take 1 tablet by mouth every morning., Disp: 30 tablet, Rfl: 0   Methylphenidate HCl ER 54 MG TB24, Take 1 tablet by mouth in the morning., Disp: 30 tablet, Rfl: 0   Methylphenidate HCl ER 54 MG TB24, Take 1 tablet by mouth in the morning., Disp: 30 tablet, Rfl: 0   norethindrone-ethinyl estradiol 1/35 (NORTREL 1/35, 28,) tablet, TAKE ONE TABLET BY MOUTH DAILY ACTIVE PILLS ONLY NO PLACEBOS, Disp: 84 tablet, Rfl: 4   PARoxetine (PAXIL) 40 MG tablet, Take 1 tablet (40 mg total) by mouth every morning., Disp: 90 tablet, Rfl: 3   TRILEPTAL 300 MG tablet, Take 3 tablets (900 mg total) by mouth 2 (two) times daily., Disp: 180 tablet, Rfl: 12 Social History   Socioeconomic History   Marital status: Single    Spouse name: Not on file   Number of children: Not on file   Years of education: Not on file   Highest education level: Not on file  Occupational History   Not on file  Tobacco Use   Smoking status: Never   Smokeless tobacco: Never  Vaping Use   Vaping Use: Never used  Substance and Sexual Activity   Alcohol use: No   Drug use: No   Sexual activity: Not on file  Other Topics Concern   Not on file  Social History Narrative   Not on file   Social Determinants of Health   Financial Resource Strain: Not on file  Food Insecurity: Not on file  Transportation Needs: Not on file  Physical Activity: Not on file  Stress: Not on file  Social Connections: Not on file  Intimate Partner Violence: Not on file   Family History  Problem Relation Age of Onset   Heart disease Father    Hyperlipidemia Father    Hypertension Father    Diabetes Father     Objective: Office vital signs reviewed. BP 132/83   Pulse (!) 104   Temp (!) 97 F (36.1 C)  (Temporal)   Ht 5' 6" (1.676 m)   Wt 212 lb 12.8 oz (96.5 kg)   SpO2 95%   BMI 34.35 kg/m   Physical Examination:  General: Awake, alert, obese, No acute distress HEENT: Sclera white.  Moist mucous membranes Cardio: regular rate and rhythm, S1S2 heard, no murmurs appreciated Pulm: clear to auscultation bilaterally, no wheezes, rhonchi or rales; normal work of breathing on room air MSK: Ambulate independently Neuro: Follows commands  Assessment/ Plan: 31 y.o. female   Dyspnea on exertion - Plan: CBC, albuterol (VENTOLIN HFA) 108 (90 Base) MCG/ACT inhaler  Essential hypertension - Plan: CMP14+EGFR, LDL Cholesterol, Direct  Attention deficit hyperactivity disorder (ADHD), combined type - Plan: ToxASSURE Select 13 (MW), Urine, Methylphenidate HCl ER 54 MG TB24, Methylphenidate HCl ER 54 MG TB24, Methylphenidate HCl ER 54 MG TB24, atomoxetine (STRATTERA) 80 MG capsule  Controlled substance agreement signed - Plan: ToxASSURE Select 13 (MW), Urine  Primary insomnia  Intellectual disability - Plan: atomoxetine (STRATTERA) 80 MG capsule  Generalized anxiety disorder - Plan: TSH, TRILEPTAL 300 MG tablet, atomoxetine (STRATTERA) 80 MG capsule  Obesity (BMI 30.0-34.9) - Plan: CMP14+EGFR, LDL Cholesterol, Direct, Bayer DCA Hb A1c Waived, TSH  Mixed obsessional thoughts and acts - Plan: TRILEPTAL 300 MG tablet, atomoxetine (STRATTERA) 80 MG capsule  Labs have been ordered for further evaluation.  Suspect that dyspnea on exertion is in fact bronchospasm.  Her lung exam was totally unremarkable today.  I am going to trial her on albuterol inhaler and I have given a spacer to utilize with this.  I would like to reassess her closely in the next couple of weeks.  If ongoing symptoms that are not responsive to this albuterol, low threshold to obtain chest x-ray and refer for further evaluation  Blood pressure is controlled.  No changes.  Refills have been sent  Mental health and ADHD are under  good control with current regimen.  Refills have been sent.  National narcotic database reviewed and there were no red flags.  UDS updated and father completed CSC FADIR  No orders of the defined types were placed in this encounter.  No orders of the defined types were placed in this encounter.    Janora Norlander, DO [facility] [phone]

## 2022-03-01 ENCOUNTER — Encounter: Payer: Self-pay | Admitting: Family Medicine

## 2022-03-01 ENCOUNTER — Telehealth: Payer: Self-pay

## 2022-03-01 LAB — CMP14+EGFR
ALT: 24 IU/L (ref 0–32)
AST: 19 IU/L (ref 0–40)
Albumin/Globulin Ratio: 1.5 (ref 1.2–2.2)
Albumin: 4.3 g/dL (ref 3.9–4.9)
Alkaline Phosphatase: 132 IU/L — ABNORMAL HIGH (ref 44–121)
BUN/Creatinine Ratio: 15 (ref 9–23)
BUN: 12 mg/dL (ref 6–20)
Bilirubin Total: <0.2 mg/dL (ref 0.0–1.2)
CO2: 21 mmol/L (ref 20–29)
Calcium: 9.8 mg/dL (ref 8.7–10.2)
Chloride: 100 mmol/L (ref 96–106)
Creatinine, Ser: 0.81 mg/dL (ref 0.57–1.00)
Globulin, Total: 2.9 g/dL (ref 1.5–4.5)
Glucose: 123 mg/dL — ABNORMAL HIGH (ref 70–99)
Potassium: 4.3 mmol/L (ref 3.5–5.2)
Sodium: 138 mmol/L (ref 134–144)
Total Protein: 7.2 g/dL (ref 6.0–8.5)
eGFR: 99 mL/min/{1.73_m2} (ref >59)

## 2022-03-01 LAB — CBC
Hematocrit: 40.5 % (ref 34.0–46.6)
Hemoglobin: 13.3 g/dL (ref 11.1–15.9)
MCH: 27.4 pg (ref 26.6–33.0)
MCHC: 32.8 g/dL (ref 31.5–35.7)
MCV: 83 fL (ref 79–97)
Platelets: 412 10*3/uL (ref 150–450)
RBC: 4.86 x10E6/uL (ref 3.77–5.28)
RDW: 13.9 % (ref 11.7–15.4)
WBC: 12.9 10*3/uL — ABNORMAL HIGH (ref 3.4–10.8)

## 2022-03-01 LAB — LDL CHOLESTEROL, DIRECT: LDL Direct: 134 mg/dL — ABNORMAL HIGH (ref 0–99)

## 2022-03-01 LAB — TSH: TSH: 1.59 u[IU]/mL (ref 0.450–4.500)

## 2022-03-01 NOTE — Telephone Encounter (Signed)
TRILEPTAL 300MG  HAS BEEN APROVED THROUGH [employer]   APPROVAL NUMBER: - APPROVED THROUGH 02/24/2023.  04/26/2023

## 2022-03-02 LAB — TOXASSURE SELECT 13 (MW), URINE

## 2022-03-07 ENCOUNTER — Ambulatory Visit: Payer: Medicaid Other

## 2022-05-23 ENCOUNTER — Encounter: Payer: Self-pay | Admitting: Family Medicine

## 2022-05-23 DIAGNOSIS — J301 Allergic rhinitis due to pollen: Secondary | ICD-10-CM

## 2022-05-23 MED ORDER — FLUTICASONE PROPIONATE 50 MCG/ACT NA SUSP: 1.0000 | Freq: Every day | NASAL | 1 refills | Status: AC

## 2022-05-29 ENCOUNTER — Encounter: Payer: Self-pay | Admitting: Family Medicine

## 2022-05-30 ENCOUNTER — Encounter: Payer: Self-pay | Admitting: Family

## 2022-05-30 ENCOUNTER — Telehealth (INDEPENDENT_AMBULATORY_CARE_PROVIDER_SITE_OTHER): Payer: Medicaid Other | Admitting: Family

## 2022-05-30 DIAGNOSIS — L03011 Cellulitis of right finger: Secondary | ICD-10-CM

## 2022-05-30 MED ORDER — CEPHALEXIN 500 MG PO CAPS: 500.0000 mg | ORAL_CAPSULE | Freq: Three times a day (TID) | ORAL | 0 refills | Status: DC

## 2022-05-30 NOTE — Patient Instructions (Signed)
Paronychia Paronychia is an infection of the skin that surrounds a nail. It usually affects the skin around a fingernail, but it may also occur near a toenail. It often causes pain and swelling around the nail. In some cases, a collection of pus (abscess) can form near or under the nail.  This condition may develop suddenly, or it may develop gradually over a longer period. In most cases, paronychia is not serious, and it will clear up with treatment. What are the causes? This condition may be caused by bacteria or a fungus, such as yeast. The bacteria or fungus can enter the body through an opening in the skin, such as a cut or a hangnail, and cause an infection in your fingernail or toenail. Other causes may include: Recurrent injury to the fingernail or toenail area. Irritation of the base and sides of the nail (cuticle). Injury and irritation can result in inflammation, swelling, and thickened skin around the nail. What increases the risk? This condition is more likely to develop in people who: Get their hands wet often, such as those who work as dishwashers, bartenders, or housekeepers. Bite their fingernails or cuticles. Have underlying skin conditions. Have hangnails or injured fingertips. Are exposed to irritants like detergents and other chemicals. Have diabetes. What are the signs or symptoms? Symptoms of this condition include: Redness and swelling of the skin near the nail. Tenderness around the nail when you touch the area. Pus-filled bumps under the cuticle. Fluid or pus under the nail. Throbbing pain in the area. How is this diagnosed? This condition is diagnosed with a physical exam. In some cases, a sample of pus may be tested to determine what type of bacteria or fungus is causing the condition. How is this treated? Treatment depends on the cause and severity of your condition. If your condition is mild, it may clear up on its own in a few days or after soaking in warm  water. If needed, treatment may include: Antibiotic medicine, if your infection is caused by bacteria. Antifungal medicine, if your infection is caused by a fungus. A procedure to drain pus from an abscess. Anti-inflammatory medicine (corticosteroids). Removal of part of an ingrown toenail. A bandage (dressing) may be placed over the affected area if an abscess or part of a nail has been removed. Follow these instructions at home: Wound care Keep the affected area clean. Soak the affected area in warm water if told to do so by your health care provider. You may be told to do this for 20 minutes, 2-3 times a day. Keep the area dry when you are not soaking it. Do not try to drain an abscess yourself. Follow instructions from your health care provider about how to take care of the affected area. Make sure you: Wash your hands with soap and water for at least 20 seconds before and after you change your dressing. If soap and water are not available, use hand sanitizer. Change your dressing as told by your health care provider. If you had an abscess drained, check the area every day for signs of infection. Check for: Redness, swelling, or pain. Fluid or blood. Warmth. Pus or a bad smell. Medicines  Take over-the-counter and prescription medicines only as told by your health care provider. If you were prescribed an antibiotic medicine, take it as told by your health care provider. Do not stop taking the antibiotic even if you start to feel better. General instructions Avoid contact with any skin irritants or allergens.   Do not pick at the affected area. Keep all follow-up visits as told. This is important. Prevention To prevent this condition from happening again: Wear rubber gloves when washing dishes or doing other tasks that require your hands to get wet. Wear gloves if your hands might come in contact with cleaners or other chemicals. Avoid injuring your nails or fingertips. Do not bite  your nails or tear hangnails. Do not cut your nails very short. Do not cut your cuticles. Use clean nail clippers or scissors when trimming nails. Contact a health care provider if: Your symptoms get worse or do not improve with treatment. You have continued or increased fluid, blood, or pus coming from the affected area. Your affected finger, toe, or joint becomes swollen or difficult to move. You have a fever or chills. There is redness spreading away from the affected area. Summary Paronychia is an infection of the skin that surrounds a nail. It often causes pain and swelling around the nail. In some cases, a collection of pus (abscess) can form near or under the nail. This condition may be caused by bacteria or a fungus. These germs can enter the body through an opening in the skin, such as a cut or a hangnail. If your condition is mild, it may clear up on its own in a few days. If needed, treatment may include medicine or a procedure to drain pus from an abscess. To prevent this condition from happening again, wear gloves if doing tasks that require your hands to get wet or to come in contact with chemicals. Also avoid injuring your nails or fingertips. This information is not intended to replace advice given to you by your health care provider. Make sure you discuss any questions you have with your health care provider. Document Revised: 09/06/2020 Document Reviewed: 09/06/2020 Elsevier Patient Education  2023 Elsevier Inc.  

## 2022-05-30 NOTE — Progress Notes (Signed)
Virtual Visit Consent   Tiffany Bates, you are scheduled for a virtual visit with a [facility] provider today. Just as with appointments in the office, your consent must be obtained to participate. Your consent will be active for this visit and any virtual visit you may have with one of our providers in the next 365 days. If you have a MyChart account, a copy of this consent can be sent to you electronically.  As this is a virtual visit, video technology does not allow for your provider to perform a traditional examination. This may limit your provider's ability to fully assess your condition. If your provider identifies any concerns that need to be evaluated in person or the need to arrange testing (such as labs, EKG, etc.), we will make arrangements to do so. Although advances in technology are sophisticated, we cannot ensure that it will always work on either your end or our end. If the connection with a video visit is poor, the visit may have to be switched to a telephone visit. With either a video or telephone visit, we are not always able to ensure that we have a secure connection.  By engaging in this virtual visit, you consent to the provision of healthcare and authorize for your insurance to be billed (if applicable) for the services provided during this visit. Depending on your insurance coverage, you may receive a charge related to this service.  I need to obtain your verbal consent now. Are you willing to proceed with your visit today? Tiffany Bates has provided verbal consent on 05/30/2022 for a virtual visit (video or telephone). Jannifer Rodney, FNP  Guardian gives verbal consent to treat.   Date: 05/30/2022 1:10 PM  Virtual Visit via Video Note   I, Jannifer Rodney, connected with  Tiffany Bates  ([id number], Apr 19, 1991) on 05/30/22 at  5:30 PM EST by a video-enabled telemedicine application and verified that I am speaking with the correct person using two  identifiers.  Location: Patient: Virtual Visit Location Patient: Other: car Provider: Virtual Visit Location Provider: Office/Clinic   I discussed the limitations of evaluation and management by telemedicine and the availability of in person appointments. The patient expressed understanding and agreed to proceed.    History of Present Illness: Tiffany Bates is a 31 y.o. who identifies as a female who was assigned female at birth, and is being seen today for infected right middle finger.  Pt bites her fingers and had a "hang nail". She started Keflex BID for the last 4 days, but ran out. She is soaking her nail and draining purulent discharge.   HPI: HPI  Problems:  Patient Active Problem List   Diagnosis Date Noted   Seasonal allergic rhinitis due to pollen 02/20/2019   BMI 30.0-30.9,adult 02/20/2019   Generalized anxiety disorder 06/29/2016   HTN (hypertension) 04/29/2014   Encephalopathy chronic 03/27/2011   Intellectual disability 03/27/2011   Gilles de la Tourette's syndrome 03/27/2011   Attention deficit hyperactivity disorder, combined type 03/27/2011   Obsessive compulsive disorder 03/27/2011    Allergies: No Known Allergies Medications:  Current Outpatient Medications:    albuterol (VENTOLIN HFA) 108 (90 Base) MCG/ACT inhaler, Inhale 2 puffs into the lungs every 6 (six) hours as needed for wheezing or shortness of breath. Use 15 minutes prior to exercise, Disp: 8 g, Rfl: 0   amLODipine (NORVASC) 2.5 MG tablet, Take 1 tablet (2.5 mg total) by mouth daily. For high blood pressure. HOLD lisinopril, Disp: 90 tablet, Rfl: 3  atomoxetine (STRATTERA) 80 MG capsule, Take 1 capsule (80 mg total) by mouth daily., Disp: 90 capsule, Rfl: 3   cephALEXin (KEFLEX) 500 MG capsule, Take 1 capsule (500 mg total) by mouth 3 (three) times daily., Disp: 21 capsule, Rfl: 0   fluticasone (FLONASE) 50 MCG/ACT nasal spray, Place 1 spray into both nostrils daily., Disp: 16 g, Rfl: 1   hydrOXYzine  (ATARAX) 50 MG tablet, Take 1 tablet (50 mg total) by mouth at bedtime., Disp: 90 tablet, Rfl: 3   lisinopril (ZESTRIL) 10 MG tablet, Take 1 tablet (10 mg total) by mouth daily., Disp: 90 tablet, Rfl: 3   Methylphenidate HCl ER 54 MG TB24, Take 1 tablet by mouth in the morning., Disp: 30 tablet, Rfl: 0   Methylphenidate HCl ER 54 MG TB24, Take 1 tablet by mouth every morning., Disp: 30 tablet, Rfl: 0   Methylphenidate HCl ER 54 MG TB24, Take 1 tablet by mouth in the morning., Disp: 30 tablet, Rfl: 0   norethindrone-ethinyl estradiol 1/35 (NORTREL 1/35, 28,) tablet, TAKE ONE TABLET BY MOUTH DAILY ACTIVE PILLS ONLY NO PLACEBOS, Disp: 84 tablet, Rfl: 4   PARoxetine (PAXIL) 40 MG tablet, Take 1 tablet (40 mg total) by mouth every morning., Disp: 90 tablet, Rfl: 3   TRILEPTAL 300 MG tablet, Take 3 tablets (900 mg total) by mouth 2 (two) times daily., Disp: 180 tablet, Rfl: 12  Observations/Objective: Patient is well-developed, well-nourished in no acute distress.  Resting comfortably  at home.  Head is normocephalic, atraumatic.  No labored breathing.  Speech is clear and coherent with logical content.  Patient is alert and oriented at baseline.  Right middle finger swollen, erythemas, and prulent discharge present   Assessment and Plan: 1. Paronychia of finger of right hand - cephALEXin (KEFLEX) 500 MG capsule; Take 1 capsule (500 mg total) by mouth 3 (three) times daily.  Dispense: 21 capsule; Refill: 0  Continue warm soak Start Keflex TID  Avoid biting or picking at fingers Follow up if symptoms worsen or do not improve   Follow Up Instructions: I discussed the assessment and treatment plan with the patient. The patient was provided an opportunity to ask questions and all were answered. The patient agreed with the plan and demonstrated an understanding of the instructions.  A copy of instructions were sent to the patient via MyChart unless otherwise noted below.     The patient was  advised to call back or seek an in-person evaluation if the symptoms worsen or if the condition fails to improve as anticipated.  Time:  I spent 12 minutes with the patient via telehealth technology discussing the above problems/concerns.    Jannifer Rodney, FNP

## 2022-07-19 ENCOUNTER — Telehealth (INDEPENDENT_AMBULATORY_CARE_PROVIDER_SITE_OTHER): Payer: Medicaid Other | Admitting: Family Medicine

## 2022-07-19 ENCOUNTER — Encounter: Payer: Self-pay | Admitting: Family Medicine

## 2022-07-19 VITALS — BP 130/70 | Wt 208.2 lb

## 2022-07-19 DIAGNOSIS — I10 Essential (primary) hypertension: Secondary | ICD-10-CM

## 2022-07-19 DIAGNOSIS — F902 Attention-deficit hyperactivity disorder, combined type: Secondary | ICD-10-CM

## 2022-07-19 MED ORDER — METHYLPHENIDATE HCL ER 54 MG PO TB24: 1.0000 | ORAL_TABLET | Freq: Every morning | ORAL | 0 refills | Status: DC

## 2022-07-19 MED ORDER — NORTREL 1/35 (28) 1-35 MG-MCG PO TABS: ORAL_TABLET | ORAL | 4 refills | Status: DC

## 2022-07-19 NOTE — Progress Notes (Signed)
MyChart Video visit  Subjective: JG:GEZM PCP: Janora Norlander, DO [id number] [place] is a 32 y.o. female. Patient provides verbal consent for consult held via video.  Due to COVID-19 pandemic this visit was conducted virtually. This visit type was conducted due to national recommendations for restrictions regarding the COVID-19 Pandemic (e.g. social distancing, sheltering in place) in an effort to limit this patient's exposure and mitigate transmission in our community. All issues noted in this document were discussed and addressed.  A physical exam was not performed with this format.   Location of patient: home Location of provider: [facility] Others present for call: dad  1.  ADHD Patient reports compliance with medications.  Not having any red flag signs or symptoms.  Her mood has been stable.    2.  Hypertension Compliant with antihypertensive.  Now totally off of lisinopril.  Not having any issues with coughing.  Blood pressure has been well-controlled.  Taking Norvasc 2.5 mg daily without difficulty.  No chest pain, shortness of breath or dizziness reported today   ROS: Per HPI  No Known Allergies Past Medical History:  Diagnosis Date   ADHD (attention deficit hyperactivity disorder)    Hypertension    Intellectual disability    Tourette syndrome     Current Outpatient Medications:    albuterol (VENTOLIN HFA) 108 (90 Base) MCG/ACT inhaler, Inhale 2 puffs into the lungs every 6 (six) hours as needed for wheezing or shortness of breath. Use 15 minutes prior to exercise, Disp: 8 g, Rfl: 0   amLODipine (NORVASC) 2.5 MG tablet, Take 1 tablet (2.5 mg total) by mouth daily. For high blood pressure. HOLD lisinopril, Disp: 90 tablet, Rfl: 3   atomoxetine (STRATTERA) 80 MG capsule, Take 1 capsule (80 mg total) by mouth daily., Disp: 90 capsule, Rfl: 3   cephALEXin (KEFLEX) 500 MG capsule, Take 1 capsule (500 mg total) by mouth 3 (three) times daily., Disp: 21 capsule, Rfl: 0    fluticasone (FLONASE) 50 MCG/ACT nasal spray, Place 1 spray into both nostrils daily., Disp: 16 g, Rfl: 1   hydrOXYzine (ATARAX) 50 MG tablet, Take 1 tablet (50 mg total) by mouth at bedtime., Disp: 90 tablet, Rfl: 3   lisinopril (ZESTRIL) 10 MG tablet, Take 1 tablet (10 mg total) by mouth daily., Disp: 90 tablet, Rfl: 3   Methylphenidate HCl ER 54 MG TB24, Take 1 tablet by mouth in the morning., Disp: 30 tablet, Rfl: 0   Methylphenidate HCl ER 54 MG TB24, Take 1 tablet by mouth every morning., Disp: 30 tablet, Rfl: 0   Methylphenidate HCl ER 54 MG TB24, Take 1 tablet by mouth in the morning., Disp: 30 tablet, Rfl: 0   norethindrone-ethinyl estradiol 1/35 (NORTREL 1/35, 28,) tablet, TAKE ONE TABLET BY MOUTH DAILY ACTIVE PILLS ONLY NO PLACEBOS, Disp: 84 tablet, Rfl: 4   PARoxetine (PAXIL) 40 MG tablet, Take 1 tablet (40 mg total) by mouth every morning., Disp: 90 tablet, Rfl: 3   TRILEPTAL 300 MG tablet, Take 3 tablets (900 mg total) by mouth 2 (two) times daily., Disp: 180 tablet, Rfl: 12  Blood pressure 130/70, weight 208 lb 3.2 oz (94.4 kg).  Gen: well appearing female, NAD Pulm: normal WOB on room air. No coughing  Assessment/ Plan: 32 y.o. female   Attention deficit hyperactivity disorder (ADHD), combined type - Plan: Methylphenidate HCl ER 54 MG TB24, Methylphenidate HCl ER 54 MG TB24, Methylphenidate HCl ER 54 MG TB24  Essential hypertension  Medications have been renewed.  Up-to-date on  UDS and CSC.  National narcotic database reviewed and there were no red flags  Blood pressure well-controlled on Norvasc 2.5 mg daily.  No changes.     14-month video visit scheduled  Start time: 8:38a End time: 8:46a  Total time spent on patient care (including video visit/ documentation): 8 minutes  [place], [place] [phone]

## 2022-08-22 ENCOUNTER — Encounter: Payer: Self-pay | Admitting: *Deleted

## 2022-09-28 ENCOUNTER — Encounter: Payer: Self-pay | Admitting: Family Medicine

## 2022-09-28 ENCOUNTER — Telehealth (INDEPENDENT_AMBULATORY_CARE_PROVIDER_SITE_OTHER): Payer: Medicaid Other | Admitting: Family Medicine

## 2022-09-28 DIAGNOSIS — F902 Attention-deficit hyperactivity disorder, combined type: Secondary | ICD-10-CM

## 2022-09-28 DIAGNOSIS — L03011 Cellulitis of right finger: Secondary | ICD-10-CM | POA: Diagnosis not present

## 2022-09-28 MED ORDER — METHYLPHENIDATE HCL ER 54 MG PO TB24
1.0000 | ORAL_TABLET | Freq: Every morning | ORAL | 0 refills | Status: DC
Start: 2022-09-28 — End: 2022-12-27

## 2022-09-28 MED ORDER — CEPHALEXIN 500 MG PO CAPS: 500.0000 mg | ORAL_CAPSULE | Freq: Three times a day (TID) | ORAL | 0 refills | Status: AC

## 2022-09-28 MED ORDER — METHYLPHENIDATE HCL ER 54 MG PO TB24: 1.0000 | ORAL_TABLET | Freq: Every morning | ORAL | 0 refills | Status: DC

## 2022-09-28 NOTE — Progress Notes (Signed)
MyChart Video visit  Subjective: CC: ADHD follow up PCP: Tiffany Ip, DO ITG:[id number] Tiffany Bates is a 32 y.o. female. Patient provides verbal consent for consult held via video.  Due to COVID-19 pandemic this visit was conducted virtually. This visit type was conducted due to national recommendations for restrictions regarding the COVID-19 Pandemic (e.g. social distancing, sheltering in place) in an effort to limit this patient's exposure and mitigate transmission in our community. All issues noted in this document were discussed and addressed.  A physical exam was not performed with this format.   Location of patient: dad Location of provider: [facility] Others present for call: car  1. ADHD Patient is accompanied today by her father.  They note that everything has been going well at home.  She continues to take the methylphenidate as directed with good response.  No concerning side effects including decreased appetite, insomnia, increased anxiety or excessive dryness.  No reports of constipation with the medication.  She is been sleeping well with the hydroxyzine.  2.  Cuticle infection They do note that she chews on her fingers a bit due to anxiety.  She has previously been treated for paronychia and they think that another 1 is happening on the right pointer finger today.  They note some swelling and redness and asked for treatment of this today.  Of note they have tried multiple modalities to deter her from chewing on her fingers but nothing really has worked   ROS: Per HPI  No Known Allergies Past Medical History:  Diagnosis Date   ADHD (attention deficit hyperactivity disorder)    Hypertension    Intellectual disability    Tourette syndrome     Current Outpatient Medications:    albuterol (VENTOLIN HFA) 108 (90 Base) MCG/ACT inhaler, Inhale 2 puffs into the lungs every 6 (six) hours as needed for wheezing or shortness of breath. Use 15 minutes prior to exercise, Disp: 8 g, Rfl: 0    amLODipine (NORVASC) 2.5 MG tablet, Take 1 tablet (2.5 mg total) by mouth daily. For high blood pressure. HOLD lisinopril, Disp: 90 tablet, Rfl: 3   atomoxetine (STRATTERA) 80 MG capsule, Take 1 capsule (80 mg total) by mouth daily., Disp: 90 capsule, Rfl: 3   fluticasone (FLONASE) 50 MCG/ACT nasal spray, Place 1 spray into both nostrils daily., Disp: 16 g, Rfl: 1   hydrOXYzine (ATARAX) 50 MG tablet, Take 1 tablet (50 mg total) by mouth at bedtime., Disp: 90 tablet, Rfl: 3   Methylphenidate HCl ER 54 MG TB24, Take 1 tablet by mouth every morning., Disp: 30 tablet, Rfl: 0   Methylphenidate HCl ER 54 MG TB24, Take 1 tablet by mouth in the morning., Disp: 30 tablet, Rfl: 0   Methylphenidate HCl ER 54 MG TB24, Take 1 tablet by mouth in the morning., Disp: 30 tablet, Rfl: 0   norethindrone-ethinyl estradiol 1/35 (NORTREL 1/35, 28,) tablet, TAKE ONE TABLET BY MOUTH DAILY ACTIVE PILLS ONLY NO PLACEBOS, Disp: 84 tablet, Rfl: 4   PARoxetine (PAXIL) 40 MG tablet, Take 1 tablet (40 mg total) by mouth every morning., Disp: 90 tablet, Rfl: 3   TRILEPTAL 300 MG tablet, Take 3 tablets (900 mg total) by mouth 2 (two) times daily., Disp: 180 tablet, Rfl: 12  Assessment/ Plan: 32 y.o. female   Paronychia of finger of right hand - Plan: cephALEXin (KEFLEX) 500 MG capsule  Attention deficit hyperactivity disorder (ADHD), combined type - Plan: Methylphenidate HCl ER 54 MG TB24, Methylphenidate HCl ER 54 MG TB24, Methylphenidate  HCl ER 54 MG TB24  Keflex sent.  Encouraged to try her best not to chew on the fingers  ADHD medications consistent.  Her condition is chronic and stable.  She is up-to-date on UDS and CSC.  National narcotic database reviewed and there were no red flags.  We will set up 47-month follow-up via video visit and again I offered 7:50 AM virtual visit if that is more convenient to the family and her father did request this again and apologized for forgetting.  Start time: text sent 7:50a, 7:51a,  7:52a. Connected with father at 7:56am End time: 8:02a  Total time spent on patient care (including video visit/ documentation): 6 minutes  Tiffany Kama Hulen Skains, DO [facility] [phone]

## 2022-10-06 ENCOUNTER — Telehealth: Payer: Medicaid Other | Admitting: Family Medicine

## 2022-12-17 ENCOUNTER — Other Ambulatory Visit: Payer: Self-pay | Admitting: Family Medicine

## 2022-12-17 DIAGNOSIS — I10 Essential (primary) hypertension: Secondary | ICD-10-CM

## 2022-12-17 DIAGNOSIS — F5101 Primary insomnia: Secondary | ICD-10-CM

## 2022-12-26 ENCOUNTER — Encounter: Payer: Self-pay | Admitting: Family Medicine

## 2022-12-27 ENCOUNTER — Encounter: Payer: Self-pay | Admitting: Family Medicine

## 2022-12-27 ENCOUNTER — Telehealth (INDEPENDENT_AMBULATORY_CARE_PROVIDER_SITE_OTHER): Payer: MEDICAID | Admitting: Family Medicine

## 2022-12-27 DIAGNOSIS — F422 Mixed obsessional thoughts and acts: Secondary | ICD-10-CM

## 2022-12-27 DIAGNOSIS — F902 Attention-deficit hyperactivity disorder, combined type: Secondary | ICD-10-CM | POA: Diagnosis not present

## 2022-12-27 DIAGNOSIS — F411 Generalized anxiety disorder: Secondary | ICD-10-CM | POA: Diagnosis not present

## 2022-12-27 DIAGNOSIS — F79 Unspecified intellectual disabilities: Secondary | ICD-10-CM | POA: Diagnosis not present

## 2022-12-27 DIAGNOSIS — I10 Essential (primary) hypertension: Secondary | ICD-10-CM

## 2022-12-27 DIAGNOSIS — F5101 Primary insomnia: Secondary | ICD-10-CM

## 2022-12-27 MED ORDER — ATOMOXETINE HCL 80 MG PO CAPS: 80.0000 mg | ORAL_CAPSULE | Freq: Every day | ORAL | 3 refills | Status: DC

## 2022-12-27 MED ORDER — PAROXETINE HCL 40 MG PO TABS: 40.0000 mg | ORAL_TABLET | ORAL | 3 refills | Status: DC

## 2022-12-27 MED ORDER — TRILEPTAL 300 MG PO TABS: 900.0000 mg | ORAL_TABLET | Freq: Two times a day (BID) | ORAL | 12 refills | Status: DC

## 2022-12-27 MED ORDER — METHYLPHENIDATE HCL ER 54 MG PO TB24: 1.0000 | ORAL_TABLET | Freq: Every morning | ORAL | 0 refills | Status: DC

## 2022-12-27 MED ORDER — AMLODIPINE BESYLATE 2.5 MG PO TABS
ORAL_TABLET | ORAL | 3 refills | Status: DC
Start: 2022-12-27 — End: 2023-03-14

## 2022-12-27 MED ORDER — METHYLPHENIDATE HCL ER 54 MG PO TB24
1.0000 | ORAL_TABLET | Freq: Every morning | ORAL | 0 refills | Status: DC
Start: 2023-02-02 — End: 2023-03-14

## 2022-12-27 MED ORDER — HYDROXYZINE HCL 50 MG PO TABS: 50.0000 mg | ORAL_TABLET | Freq: Every day | ORAL | 3 refills | Status: DC

## 2022-12-27 NOTE — Progress Notes (Signed)
MyChart Video visit  Subjective: ZO:XWRU PCP: Raliegh Ip, DO EAV:[id number] Smyre is a 32 y.o. female. Patient provides verbal consent for consult held via video.  Due to COVID-19 pandemic this visit was conducted virtually. This visit type was conducted due to national recommendations for restrictions regarding the COVID-19 Pandemic (e.g. social distancing, sheltering in place) in an effort to limit this patient's exposure and mitigate transmission in our community. All issues noted in this document were discussed and addressed.  A physical exam was not performed with this format.   Location of patient: home Location of provider: [facility] Others present for call: mom, dad  1. ADHD Patient has been stable on medications.  Needs refills on methylphenidate, Atarax, Strattera, brand-name Trileptal and Paxil.  No concerns reported by parents today but they note that she was recently stung multiple times by bee.  Luckily, symptoms got better with topical corticosteroid and Benadryl   ROS: Per HPI  No Known Allergies Past Medical History:  Diagnosis Date   ADHD (attention deficit hyperactivity disorder)    Hypertension    Intellectual disability    Tourette syndrome     Current Outpatient Medications:    albuterol (VENTOLIN HFA) 108 (90 Base) MCG/ACT inhaler, Inhale 2 puffs into the lungs every 6 (six) hours as needed for wheezing or shortness of breath. Use 15 minutes prior to exercise, Disp: 8 g, Rfl: 0   amLODipine (NORVASC) 2.5 MG tablet, TAKE 1 TABLET BY MOUTH DAILY FOR FOR HIGH BLOOD PRESSURE, Disp: 90 tablet, Rfl: 0   atomoxetine (STRATTERA) 80 MG capsule, Take 1 capsule (80 mg total) by mouth daily., Disp: 90 capsule, Rfl: 3   fluticasone (FLONASE) 50 MCG/ACT nasal spray, Place 1 spray into both nostrils daily., Disp: 16 g, Rfl: 1   hydrOXYzine (ATARAX) 50 MG tablet, TAKE 1 TABLET BY MOUTH AT BEDTIME, Disp: 90 tablet, Rfl: 0   Methylphenidate HCl ER 54 MG TB24, Take 1 tablet by  mouth in the morning., Disp: 30 tablet, Rfl: 0   Methylphenidate HCl ER 54 MG TB24, Take 1 tablet by mouth in the morning., Disp: 30 tablet, Rfl: 0   Methylphenidate HCl ER 54 MG TB24, Take 1 tablet by mouth every morning., Disp: 30 tablet, Rfl: 0   norethindrone-ethinyl estradiol 1/35 (NORTREL 1/35, 28,) tablet, TAKE ONE TABLET BY MOUTH DAILY ACTIVE PILLS ONLY NO PLACEBOS, Disp: 84 tablet, Rfl: 4   PARoxetine (PAXIL) 40 MG tablet, Take 1 tablet (40 mg total) by mouth every morning., Disp: 90 tablet, Rfl: 3   TRILEPTAL 300 MG tablet, Take 3 tablets (900 mg total) by mouth 2 (two) times daily., Disp: 180 tablet, Rfl: 12  Gen: well appearing female, NAD Pulm: normal WOB on room air. Psych: mood stable, conversive  Assessment/ Plan: 32 y.o. female   Attention deficit hyperactivity disorder (ADHD), combined type - Plan: Methylphenidate HCl ER 54 MG TB24, Methylphenidate HCl ER 54 MG TB24, Methylphenidate HCl ER 54 MG TB24, atomoxetine (STRATTERA) 80 MG capsule  Generalized anxiety disorder - Plan: PARoxetine (PAXIL) 40 MG tablet, TRILEPTAL 300 MG tablet, atomoxetine (STRATTERA) 80 MG capsule  Mixed obsessional thoughts and acts - Plan: PARoxetine (PAXIL) 40 MG tablet, TRILEPTAL 300 MG tablet, atomoxetine (STRATTERA) 80 MG capsule  Intellectual disability - Plan: atomoxetine (STRATTERA) 80 MG capsule  Primary insomnia - Plan: hydrOXYzine (ATARAX) 50 MG tablet  Essential hypertension - Plan: amLODipine (NORVASC) 2.5 MG tablet  ADHD chronic and stable.  UDS and CSA are up-to-date through September.  Advised that  we will need to update this at next visit which will be scheduled for 03/14/2023 at 10:15 AM.  National narcotic database reviewed and there were no red flags.  Postdated scripts have been sent  Anxiety disorder stable.  Hydroxyzine, Paxil, Strattera and Trileptal renewed.  Hypertension not discussed but needed BP renewal meds so this was sent  Start time: 7:49am End time:  7:56a  Total time spent on patient care (including video visit/ documentation): 6 minutes  Marvens Hollars Hulen Skains, DO [facility] [phone]

## 2023-03-13 ENCOUNTER — Encounter: Payer: Self-pay | Admitting: Family Medicine

## 2023-03-13 ENCOUNTER — Other Ambulatory Visit: Payer: Self-pay | Admitting: Family Medicine

## 2023-03-14 ENCOUNTER — Encounter: Payer: Self-pay | Admitting: Family Medicine

## 2023-03-14 ENCOUNTER — Telehealth: Payer: Self-pay

## 2023-03-14 ENCOUNTER — Ambulatory Visit (INDEPENDENT_AMBULATORY_CARE_PROVIDER_SITE_OTHER): Payer: MEDICAID | Admitting: Family Medicine

## 2023-03-14 VITALS — BP 142/94 | HR 118 | Temp 98.5°F | Ht 66.0 in | Wt 202.0 lb

## 2023-03-14 DIAGNOSIS — Z79899 Other long term (current) drug therapy: Secondary | ICD-10-CM

## 2023-03-14 DIAGNOSIS — R7303 Prediabetes: Secondary | ICD-10-CM

## 2023-03-14 DIAGNOSIS — F902 Attention-deficit hyperactivity disorder, combined type: Secondary | ICD-10-CM

## 2023-03-14 DIAGNOSIS — E669 Obesity, unspecified: Secondary | ICD-10-CM

## 2023-03-14 DIAGNOSIS — I10 Essential (primary) hypertension: Secondary | ICD-10-CM

## 2023-03-14 LAB — LIPID PANEL

## 2023-03-14 LAB — BAYER DCA HB A1C WAIVED: HB A1C (BAYER DCA - WAIVED): 5.5 % (ref 4.8–5.6)

## 2023-03-14 MED ORDER — METHYLPHENIDATE HCL ER 54 MG PO TB24
1.0000 | ORAL_TABLET | Freq: Every morning | ORAL | 0 refills | Status: DC
Start: 2023-04-13 — End: 2023-08-29

## 2023-03-14 MED ORDER — METHYLPHENIDATE HCL ER 54 MG PO TB24: 1.0000 | ORAL_TABLET | Freq: Every morning | ORAL | 0 refills | Status: DC

## 2023-03-14 MED ORDER — NORTREL 1/35 (28) 1-35 MG-MCG PO TABS: ORAL_TABLET | ORAL | 4 refills | Status: DC

## 2023-03-14 MED ORDER — AMLODIPINE BESYLATE 5 MG PO TABS
ORAL_TABLET | ORAL | 3 refills | Status: DC
Start: 2023-03-14 — End: 2023-11-26

## 2023-03-14 NOTE — Patient Instructions (Addendum)
I will call you for your video visit at the normal 7:50am (just ignore whatever video visit time shows up on MyChart) Have mom please fax [place] the form that shows you got your flu shot when she gives it to you so we can update your file. Watch the blood pressure. Goal is less than 140/90.  It was high today so I increased the Amlodipine to 5mg .

## 2023-03-14 NOTE — Telephone Encounter (Signed)
Tiffany Bates ([name]) PA Case ID #: [id number] Rx #: [id number] Need Help? Call us at [phone] Status sent iconSent to Plan today Drug Trileptal 300MG  tablets ePA cloud logo Form [employer] ePA Form (2017 NCPDP) Original Claim Info 75 Prior Authorization Required Dispense a 72-hour supply, if appropriate, while awaiting PA. Pharmacy help desk ph.# [phone];

## 2023-03-14 NOTE — Progress Notes (Signed)
Subjective: CC: ADHD follow-up PCP: Raliegh Ip, DO UXL:[id number] Tiffany Bates is a 32 y.o. female presenting to clinic today for:  1.  ADHD Patient is compliant with methylphenidate ER 54 mg daily.  No reports of constipation, difficulty sleeping etc.  She has been actively working on weight loss and has been cutting out unnecessary sugars and snacks as well as swimming and walking regularly.  She is accompanied today by her grandma.  She reports feeling well today.  2.  Hypertension Her grandmother reports that her blood pressure typically is running over 140/90 despite compliance with amlodipine.  Patient reports no visual disturbance, chest pain, shortness of breath or edema.  They have been making lifestyle modifications as above   ROS: Per HPI  No Known Allergies Past Medical History:  Diagnosis Date   ADHD (attention deficit hyperactivity disorder)    Hypertension    Intellectual disability    Tourette syndrome     Current Outpatient Medications:    albuterol (VENTOLIN HFA) 108 (90 Base) MCG/ACT inhaler, Inhale 2 puffs into the lungs every 6 (six) hours as needed for wheezing or shortness of breath. Use 15 minutes prior to exercise, Disp: 8 g, Rfl: 0   amLODipine (NORVASC) 2.5 MG tablet, TAKE 1 TABLET BY MOUTH DAILY FOR FOR HIGH BLOOD PRESSURE, Disp: 90 tablet, Rfl: 3   atomoxetine (STRATTERA) 80 MG capsule, Take 1 capsule (80 mg total) by mouth daily., Disp: 90 capsule, Rfl: 3   fluticasone (FLONASE) 50 MCG/ACT nasal spray, Place 1 spray into both nostrils daily., Disp: 16 g, Rfl: 1   hydrOXYzine (ATARAX) 50 MG tablet, Take 1 tablet (50 mg total) by mouth at bedtime., Disp: 90 tablet, Rfl: 3   Methylphenidate HCl ER 54 MG TB24, Take 1 tablet by mouth in the morning., Disp: 30 tablet, Rfl: 0   Methylphenidate HCl ER 54 MG TB24, Take 1 tablet by mouth every morning., Disp: 30 tablet, Rfl: 0   Methylphenidate HCl ER 54 MG TB24, Take 1 tablet by mouth in the morning., Disp: 30  tablet, Rfl: 0   norethindrone-ethinyl estradiol 1/35 (NORTREL 1/35, 28,) tablet, TAKE ONE TABLET BY MOUTH DAILY ACTIVE PILLS ONLY NO PLACEBOS, Disp: 84 tablet, Rfl: 4   PARoxetine (PAXIL) 40 MG tablet, Take 1 tablet (40 mg total) by mouth every morning., Disp: 90 tablet, Rfl: 3   TRILEPTAL 300 MG tablet, Take 3 tablets (900 mg total) by mouth 2 (two) times daily. BRAND NAME MEDICALLY NECESSARY, Disp: 180 tablet, Rfl: 12 Social History   Socioeconomic History   Marital status: Single    Spouse name: Not on file   Number of children: Not on file   Years of education: Not on file   Highest education level: Not on file  Occupational History   Not on file  Tobacco Use   Smoking status: Never   Smokeless tobacco: Never  Vaping Use   Vaping status: Never Used  Substance and Sexual Activity   Alcohol use: No   Drug use: No   Sexual activity: Not on file  Other Topics Concern   Not on file  Social History Narrative   Not on file   Social Determinants of Health   Financial Resource Strain: Not on file  Food Insecurity: Not on file  Transportation Needs: Not on file  Physical Activity: Not on file  Stress: Not on file  Social Connections: Not on file  Intimate Partner Violence: Not on file   Family History  Problem Relation  Age of Onset   Heart disease Father    Hyperlipidemia Father    Hypertension Father    Diabetes Father     Objective: Office vital signs reviewed. BP (!) 142/94   Pulse (!) 118   Temp 98.5 F (36.9 C)   Ht 5\' 6"  (1.676 m)   Wt 202 lb (91.6 kg)   SpO2 97%   BMI 32.60 kg/m   Physical Examination:  General: Awake, alert, well nourished, No acute distress HEENT: Sclera white.  Moist mucous membranes Cardio: regular rate and rhythm, S1S2 heard, no murmurs appreciated Pulm: clear to auscultation bilaterally, no wheezes, rhonchi or rales; normal work of breathing on room air      03/14/2023   10:22 AM 02/28/2022    1:43 PM 08/01/2021    3:11 PM   Depression screen PHQ 2/9  Decreased Interest 3 3 0  Down, Depressed, Hopeless 0 0 0  PHQ - 2 Score 3 3 0  Altered sleeping 1 0   Tired, decreased energy 1 1   Change in appetite 3 1   Feeling bad or failure about yourself  0 0   Trouble concentrating 1 1   Moving slowly or fidgety/restless 0 2   Suicidal thoughts 0 0   PHQ-9 Score 9 8   Difficult doing work/chores Somewhat difficult Somewhat difficult       03/14/2023   10:22 AM 02/28/2022    1:43 PM 08/01/2021    3:11 PM 01/25/2021   11:43 AM  GAD 7 : Generalized Anxiety Score  Nervous, Anxious, on Edge 1 1 0 0  Control/stop worrying 1 1 0 0  Worry too much - different things 1 1 0 0  Trouble relaxing 1 1 0 0  Restless 1 1 0 0  Easily annoyed or irritable 1 1 0 0  Afraid - awful might happen 1 1 0 0  Total GAD 7 Score 7 7 0 0  Anxiety Difficulty Somewhat difficult Somewhat difficult Not difficult at all Not difficult at all      Assessment/ Plan: 32 y.o. female   Attention deficit hyperactivity disorder (ADHD), combined type - Plan: ToxASSURE Select 13 (MW), Urine, Methylphenidate HCl ER 54 MG TB24, Methylphenidate HCl ER 54 MG TB24, Methylphenidate HCl ER 54 MG TB24, CBC  Controlled substance agreement signed - Plan: ToxASSURE Select 13 (MW), Urine  Prediabetes - Plan: Bayer DCA Hb A1c Waived, CMP14+EGFR, Lipid Panel, CBC  Obesity (BMI 30.0-34.9) - Plan: CMP14+EGFR, Lipid Panel, CBC  Essential hypertension - Plan: amLODipine (NORVASC) 5 MG tablet  ADHD chronic and stable.  UDS and CSC were updated as per office policy.  National narcotic database reviewed and there were no red flags.  Okay for 28-month follow-up via video visit  A1c collected today and demonstrated a normal blood sugar I have encouraged her to continue lifestyle modification to maintain health  Blood pressure was not at goal despite recheck and it sounds like it has been uncontrolled at home as well so advance her amlodipine to 5 mg daily   Dawud Mays  Hulen Skains, DO [facility] [phone]

## 2023-03-15 LAB — CMP14+EGFR
ALT: 23 IU/L (ref 0–32)
AST: 17 IU/L (ref 0–40)
Albumin: 4.4 g/dL (ref 3.9–4.9)
Alkaline Phosphatase: 103 IU/L (ref 44–121)
BUN/Creatinine Ratio: 17 (ref 9–23)
BUN: 13 mg/dL (ref 6–20)
CO2: 20 mmol/L (ref 20–29)
Calcium: 10.1 mg/dL (ref 8.7–10.2)
Chloride: 101 mmol/L (ref 96–106)
Creatinine, Ser: 0.78 mg/dL (ref 0.57–1.00)
Globulin, Total: 2.8 g/dL (ref 1.5–4.5)
Glucose: 102 mg/dL — ABNORMAL HIGH (ref 70–99)
Potassium: 4.7 mmol/L (ref 3.5–5.2)
Sodium: 136 mmol/L (ref 134–144)
Total Protein: 7.2 g/dL (ref 6.0–8.5)
eGFR: 103 mL/min/{1.73_m2} (ref >59)

## 2023-03-15 LAB — LIPID PANEL
Cholesterol, Total: 199 mg/dL (ref 100–199)
HDL: 51 mg/dL (ref >39)
LDL CALC COMMENT:: 3.9 ratio (ref 0.0–4.4)
LDL Chol Calc (NIH): 125 mg/dL — ABNORMAL HIGH (ref 0–99)
Triglycerides: 129 mg/dL (ref 0–149)
VLDL Cholesterol Cal: 23 mg/dL (ref 5–40)

## 2023-03-15 LAB — CBC
Hematocrit: 41.9 % (ref 34.0–46.6)
Hemoglobin: 13.8 g/dL (ref 11.1–15.9)
MCH: 29.2 pg (ref 26.6–33.0)
MCHC: 32.9 g/dL (ref 31.5–35.7)
MCV: 89 fL (ref 79–97)
Platelets: 369 10*3/uL (ref 150–450)
RBC: 4.72 x10E6/uL (ref 3.77–5.28)
RDW: 12.3 % (ref 11.7–15.4)
WBC: 8.9 10*3/uL (ref 3.4–10.8)

## 2023-03-15 NOTE — Telephone Encounter (Signed)
Pharmacy Patient Advocate Encounter  Received notification from [facility] that Prior Authorization for Trileptal 300MG  tablets has been APPROVED from 03/15/23 to 03/12/24   PA #/Case ID/Reference #: [id number]

## 2023-03-20 LAB — TOXASSURE SELECT 13 (MW), URINE

## 2023-08-28 ENCOUNTER — Encounter: Payer: Self-pay | Admitting: Family Medicine

## 2023-08-29 ENCOUNTER — Encounter: Payer: Self-pay | Admitting: Family Medicine

## 2023-08-29 ENCOUNTER — Telehealth (INDEPENDENT_AMBULATORY_CARE_PROVIDER_SITE_OTHER): Payer: MEDICAID | Admitting: Family Medicine

## 2023-08-29 DIAGNOSIS — F902 Attention-deficit hyperactivity disorder, combined type: Secondary | ICD-10-CM | POA: Diagnosis not present

## 2023-08-29 MED ORDER — METHYLPHENIDATE HCL ER 54 MG PO TB24
1.0000 | ORAL_TABLET | Freq: Every morning | ORAL | 0 refills | Status: DC
Start: 2023-10-27 — End: 2023-11-26

## 2023-08-29 MED ORDER — METHYLPHENIDATE HCL ER 54 MG PO TB24: 1.0000 | ORAL_TABLET | Freq: Every morning | ORAL | 0 refills | Status: DC

## 2023-08-29 MED ORDER — METHYLPHENIDATE HCL ER 54 MG PO TB24
1.0000 | ORAL_TABLET | Freq: Every morning | ORAL | 0 refills | Status: DC
Start: 2023-08-29 — End: 2023-11-26

## 2023-08-29 NOTE — Progress Notes (Signed)
 MyChart Video visit  Subjective: ZO:XWRU PCP: Raliegh Ip, DO EAV:[id number] Tiffany Bates is a 33 y.o. female. Patient provides verbal consent for consult held via video.  Due to COVID-19 pandemic this visit was conducted virtually. This visit type was conducted due to national recommendations for restrictions regarding the COVID-19 Pandemic (e.g. social distancing, sheltering in place) in an effort to limit this patient's exposure and mitigate transmission in our community. All issues noted in this document were discussed and addressed.  A physical exam was not performed with this format.   Location of patient: home Location of provider: [facility] Others present for call: grandma  1.  ADHD That she is accompanied to the visit by her granny.  She reports things are going well.  She reports no concerns.  Mood has been stable.  She has been exercising more and has joined the [employer] and is swimming.  She is also cut down sugar quite a bit and eliminated sodas totally.  She is down to 199 pounds and feels good   ROS: Per HPI  No Known Allergies Past Medical History:  Diagnosis Date   ADHD (attention deficit hyperactivity disorder)    Hypertension    Intellectual disability    Tourette syndrome     Current Outpatient Medications:    albuterol (VENTOLIN HFA) 108 (90 Base) MCG/ACT inhaler, Inhale 2 puffs into the lungs every 6 (six) hours as needed for wheezing or shortness of breath. Use 15 minutes prior to exercise, Disp: 8 g, Rfl: 0   amLODipine (NORVASC) 5 MG tablet, TAKE 1 TABLET BY MOUTH DAILY FOR FOR HIGH BLOOD PRESSURE **dose change, Disp: 90 tablet, Rfl: 3   atomoxetine (STRATTERA) 80 MG capsule, Take 1 capsule (80 mg total) by mouth daily., Disp: 90 capsule, Rfl: 3   fluticasone (FLONASE) 50 MCG/ACT nasal spray, Place 1 spray into both nostrils daily., Disp: 16 g, Rfl: 1   hydrOXYzine (ATARAX) 50 MG tablet, Take 1 tablet (50 mg total) by mouth at bedtime., Disp: 90 tablet, Rfl: 3    Methylphenidate HCl ER 54 MG TB24, Take 1 tablet by mouth every morning., Disp: 30 tablet, Rfl: 0   Methylphenidate HCl ER 54 MG TB24, Take 1 tablet by mouth in the morning., Disp: 30 tablet, Rfl: 0   Methylphenidate HCl ER 54 MG TB24, Take 1 tablet by mouth in the morning., Disp: 30 tablet, Rfl: 0   norethindrone-ethinyl estradiol 1/35 (NORTREL 1/35, 28,) tablet, TAKE 1 TABLET BY MOUTH DAILY ACTIVE PILLS ONLY NO PLACEBOS, Disp: 84 tablet, Rfl: 4   PARoxetine (PAXIL) 40 MG tablet, Take 1 tablet (40 mg total) by mouth every morning., Disp: 90 tablet, Rfl: 3   TRILEPTAL 300 MG tablet, Take 3 tablets (900 mg total) by mouth 2 (two) times daily. BRAND NAME MEDICALLY NECESSARY, Disp: 180 tablet, Rfl: 12 Gen: well appearing female, NAD Psych: happy, interactive, conversive  Assessment/ Plan: 33 y.o. female   Attention deficit hyperactivity disorder (ADHD), combined type - Plan: Methylphenidate HCl ER 54 MG TB24, Methylphenidate HCl ER 54 MG TB24, Methylphenidate HCl ER 54 MG TB24  Stable. Meds renewed. UDS/CSC UTD  The Narcotic Database has been reviewed.  There were no  red flags.  Both June med follow up scheduled AND CPE in Sept. She is aware of dates and times.  Start time: 10:51a End time: 11:00a  Total time spent on patient care (including video visit/ documentation): 11 minutes  Zigmund Linse Hulen Skains, DO [facility] [phone]

## 2023-11-08 ENCOUNTER — Encounter: Payer: Self-pay | Admitting: Family Medicine

## 2023-11-26 ENCOUNTER — Telehealth (INDEPENDENT_AMBULATORY_CARE_PROVIDER_SITE_OTHER): Payer: MEDICAID | Admitting: Family Medicine

## 2023-11-26 ENCOUNTER — Encounter: Payer: Self-pay | Admitting: Family Medicine

## 2023-11-26 DIAGNOSIS — F79 Unspecified intellectual disabilities: Secondary | ICD-10-CM

## 2023-11-26 DIAGNOSIS — F411 Generalized anxiety disorder: Secondary | ICD-10-CM

## 2023-11-26 DIAGNOSIS — F5101 Primary insomnia: Secondary | ICD-10-CM

## 2023-11-26 DIAGNOSIS — F902 Attention-deficit hyperactivity disorder, combined type: Secondary | ICD-10-CM

## 2023-11-26 DIAGNOSIS — F422 Mixed obsessional thoughts and acts: Secondary | ICD-10-CM

## 2023-11-26 DIAGNOSIS — I10 Essential (primary) hypertension: Secondary | ICD-10-CM

## 2023-11-26 MED ORDER — AMLODIPINE BESYLATE 5 MG PO TABS
ORAL_TABLET | ORAL | 3 refills | Status: AC
Start: 2023-11-26 — End: ?

## 2023-11-26 MED ORDER — METHYLPHENIDATE HCL ER 54 MG PO TB24
1.0000 | ORAL_TABLET | Freq: Every morning | ORAL | 0 refills | Status: DC
Start: 2024-01-25 — End: 2024-02-25

## 2023-11-26 MED ORDER — NORTREL 1/35 (28) 1-35 MG-MCG PO TABS: ORAL_TABLET | ORAL | 4 refills | Status: AC

## 2023-11-26 MED ORDER — TRILEPTAL 300 MG PO TABS: 900.0000 mg | ORAL_TABLET | Freq: Two times a day (BID) | ORAL | 12 refills | Status: AC

## 2023-11-26 MED ORDER — ATOMOXETINE HCL 80 MG PO CAPS: 80.0000 mg | ORAL_CAPSULE | Freq: Every day | ORAL | 3 refills | Status: AC

## 2023-11-26 MED ORDER — METHYLPHENIDATE HCL ER 54 MG PO TB24
1.0000 | ORAL_TABLET | Freq: Every morning | ORAL | 0 refills | Status: DC
Start: 2023-12-26 — End: 2024-02-25

## 2023-11-26 MED ORDER — PAROXETINE HCL 40 MG PO TABS: 40.0000 mg | ORAL_TABLET | ORAL | 3 refills | Status: AC

## 2023-11-26 MED ORDER — HYDROXYZINE HCL 50 MG PO TABS: 50.0000 mg | ORAL_TABLET | Freq: Every day | ORAL | 3 refills | Status: AC

## 2023-11-26 MED ORDER — METHYLPHENIDATE HCL ER 54 MG PO TB24
1.0000 | ORAL_TABLET | Freq: Every morning | ORAL | 0 refills | Status: DC
Start: 2023-11-26 — End: 2024-02-25

## 2023-11-26 NOTE — Progress Notes (Signed)
 MyChart Video visit  Subjective: CC: ADHD, HTN PCP: Eliodoro Guerin, DO ZOX:[id number] Tiffany Bates is a 33 y.o. female. Patient provides verbal consent for consult held via video.  Due to COVID-19 pandemic this visit was conducted virtually. This visit type was conducted due to national recommendations for restrictions regarding the COVID-19 Pandemic (e.g. social distancing, sheltering in place) in an effort to limit this patient's exposure and mitigate transmission in our community. All issues noted in this document were discussed and addressed.  A physical exam was not performed with this format.   Location of patient: home Location of provider: [facility] Others present for call: caretaker  1. HTN Exercising daily.  Drinks water only. Compliant with meds. No CP, SOB, edema.  Eats salt.  2. ADHD/ mood Stable.  Needs refills on meds.     ROS: Per HPI  No Known Allergies Past Medical History:  Diagnosis Date   ADHD (attention deficit hyperactivity disorder)    Hypertension    Intellectual disability    Tourette syndrome     Current Outpatient Medications:    albuterol  (VENTOLIN  HFA) 108 (90 Base) MCG/ACT inhaler, Inhale 2 puffs into the lungs every 6 (six) hours as needed for wheezing or shortness of breath. Use 15 minutes prior to exercise, Disp: 8 g, Rfl: 0   amLODipine  (NORVASC ) 5 MG tablet, TAKE 1 TABLET BY MOUTH DAILY FOR FOR HIGH BLOOD PRESSURE **dose change, Disp: 90 tablet, Rfl: 3   atomoxetine  (STRATTERA ) 80 MG capsule, Take 1 capsule (80 mg total) by mouth daily., Disp: 90 capsule, Rfl: 3   fluticasone  (FLONASE ) 50 MCG/ACT nasal spray, Place 1 spray into both nostrils daily., Disp: 16 g, Rfl: 1   hydrOXYzine  (ATARAX ) 50 MG tablet, Take 1 tablet (50 mg total) by mouth at bedtime., Disp: 90 tablet, Rfl: 3   Methylphenidate  HCl ER 54 MG TB24, Take 1 tablet by mouth every morning., Disp: 30 tablet, Rfl: 0   Methylphenidate  HCl ER 54 MG TB24, Take 1 tablet by mouth in the morning.,  Disp: 30 tablet, Rfl: 0   Methylphenidate  HCl ER 54 MG TB24, Take 1 tablet by mouth in the morning., Disp: 30 tablet, Rfl: 0   norethindrone-ethinyl estradiol 1/35 (NORTREL  1/35, 28,) tablet, TAKE 1 TABLET BY MOUTH DAILY ACTIVE PILLS ONLY NO PLACEBOS, Disp: 84 tablet, Rfl: 4   PARoxetine  (PAXIL ) 40 MG tablet, Take 1 tablet (40 mg total) by mouth every morning., Disp: 90 tablet, Rfl: 3   TRILEPTAL  300 MG tablet, Take 3 tablets (900 mg total) by mouth 2 (two) times daily. BRAND NAME MEDICALLY NECESSARY, Disp: 180 tablet, Rfl: 12  Blood pressure (!) 129/96, pulse (!) 102.  Gen: well appearing female, NAD Psych: mood stable speech normal  Assessment/ Plan: 33 y.o. female   Attention deficit hyperactivity disorder (ADHD), combined type - Plan: Methylphenidate  HCl ER 54 MG TB24, Methylphenidate  HCl ER 54 MG TB24, Methylphenidate  HCl ER 54 MG TB24, atomoxetine  (STRATTERA ) 80 MG capsule  Generalized anxiety disorder - Plan: atomoxetine  (STRATTERA ) 80 MG capsule, PARoxetine  (PAXIL ) 40 MG tablet, TRILEPTAL  300 MG tablet  Mixed obsessional thoughts and acts - Plan: atomoxetine  (STRATTERA ) 80 MG capsule, PARoxetine  (PAXIL ) 40 MG tablet, TRILEPTAL  300 MG tablet  Intellectual disability - Plan: atomoxetine  (STRATTERA ) 80 MG capsule  Primary insomnia - Plan: hydrOXYzine  (ATARAX ) 50 MG tablet  Essential hypertension - Plan: amLODipine  (NORVASC ) 5 MG tablet  ADHD stable/ mood stable. Meds renewed.  UDS/ CSC UTD, The Narcotic Database has been reviewed.  There were no  red flags.    Blood pressure is not at goal.  We discussed salt reduction.  Continue adequate water intake, physical exercise.  Her caregiver will monitor the blood pressure daily at noon and we will recheck again in 3 months.  If persistently elevated, we can consider adding a mild diuretic  Start time: 3:36pm End time: 3:44pm  Total time spent on patient care (including video visit/ documentation): 10 minutes  Bascom Biel Bambi Bonine,  DO [facility] [phone]

## 2024-02-25 ENCOUNTER — Ambulatory Visit: Payer: MEDICAID | Admitting: Family Medicine

## 2024-02-25 ENCOUNTER — Encounter: Payer: Self-pay | Admitting: Family Medicine

## 2024-02-25 VITALS — BP 131/87 | HR 106 | Temp 97.9°F | Ht 66.0 in | Wt 210.0 lb

## 2024-02-25 DIAGNOSIS — Z0001 Encounter for general adult medical examination with abnormal findings: Secondary | ICD-10-CM | POA: Diagnosis not present

## 2024-02-25 DIAGNOSIS — I10 Essential (primary) hypertension: Secondary | ICD-10-CM

## 2024-02-25 DIAGNOSIS — Z0000 Encounter for general adult medical examination without abnormal findings: Secondary | ICD-10-CM

## 2024-02-25 DIAGNOSIS — E66811 Obesity, class 1: Secondary | ICD-10-CM

## 2024-02-25 DIAGNOSIS — R7303 Prediabetes: Secondary | ICD-10-CM

## 2024-02-25 DIAGNOSIS — F79 Unspecified intellectual disabilities: Secondary | ICD-10-CM

## 2024-02-25 DIAGNOSIS — F902 Attention-deficit hyperactivity disorder, combined type: Secondary | ICD-10-CM | POA: Diagnosis not present

## 2024-02-25 DIAGNOSIS — F411 Generalized anxiety disorder: Secondary | ICD-10-CM

## 2024-02-25 LAB — BAYER DCA HB A1C WAIVED: HB A1C (BAYER DCA - WAIVED): 5.4 % (ref 4.8–5.6)

## 2024-02-25 MED ORDER — METHYLPHENIDATE HCL ER 54 MG PO TB24: 1.0000 | ORAL_TABLET | Freq: Every morning | ORAL | 0 refills | Status: DC

## 2024-02-25 NOTE — Progress Notes (Signed)
 Tiffany Bates is a 33 y.o. female presents to office today for annual physical exam examination.    Discussed the use of AI scribe software for clinical note transcription with the patient, who gave verbal consent to proceed.  History of Present Illness   Tiffany Bates is a 33 year old female with hypertension who presents with concerns about blood pressure management.  She has been experiencing difficulties in managing her blood pressure at home, with readings significantly higher than those taken at the doctor's office. At home, her blood pressure was recorded as 168/110, whereas at the doctor's office, it was 131/87. She is more reliable with her evening routine, and her caregiver ensures she takes her medication consistently in the evening.  Her caregiver is actively involved in managing her diet and physical activity. She has been limiting her salt intake and exercises regularly, although she does not enjoy it. Despite these efforts, she has a strong preference for sweet snacks, which her caregiver manages by offering healthier alternatives like fruit with peanut butter. Her soda intake has been reduced, replaced with water, and her coffee consumption is limited to one cup per day. However, she often will sneak snacks that are high in sugar/ calories per her mom.  She has to hide these foods.  They apparently keep sodas/ snack cakes, etc in the home.  She experiences some sleep disturbances, including tossing and turning at night. She goes to bed at 8 PM and wakes up around 7:30 or 8 AM but often feels the need to nap during the day. She has vivid dreams and racing thoughts at night, which may contribute to her sleep issues.  She engages in various activities, including swimming, attending church events, and going on trips, such as a recent visit to [place]  and an upcoming [place].  No pain, blood in stool, or abnormal bowel movements. Normal bowel  movements.      Occupation: disabled, Marital status: single, Substance use: none Health Maintenance Due  Topic Date Due   Hepatitis B Vaccines 19-59 Average Risk (1 of 3 - 19+ 3-dose series) Never done   HPV VACCINES (1 - 3-dose SCDM series) Never done   Refills needed today: ADHD meds  Immunization History  Administered Date(s) Administered   DTaP 08/20/1990, 10/22/1990, 12/24/1990, 10/08/1991   HIB (PRP-OMP) 08/20/1990, 10/22/1990, 12/24/1990, 10/08/1991   IPV 08/20/1990, 10/22/1990, 12/24/1990, 10/08/1991   Influenza,inj,Quad PF,6+ Mos 04/29/2014, 03/14/2017, 05/27/2021   Influenza-Unspecified 04/29/2014, 03/14/2017, 03/25/2019   MMR 10/08/1991   PFIZER(Purple Top)SARS-COV-2 Vaccination 02/18/2020   Td 08/18/2019   Past Medical History:  Diagnosis Date   ADHD (attention deficit hyperactivity disorder)    Hypertension    Intellectual disability    Tourette syndrome    Social History   Socioeconomic History   Marital status: Single    Spouse name: Not on file   Number of children: Not on file   Years of education: Not on file   Highest education level: Not on file  Occupational History   Not on file  Tobacco Use   Smoking status: Never   Smokeless tobacco: Never  Vaping Use   Vaping status: Never Used  Substance and Sexual Activity   Alcohol use: No   Drug use: No   Sexual activity: Not on file  Other Topics Concern   Not on file  Social History Narrative   Not on file   Social Drivers of Health   Financial Resource Strain:  Not on file  Food Insecurity: Not on file  Transportation Needs: Not on file  Physical Activity: Not on file  Stress: Not on file  Social Connections: Not on file  Intimate Partner Violence: Not on file   Past Surgical History:  Procedure Laterality Date   TONSILLECTOMY AND ADENOIDECTOMY     Family History  Problem Relation Age of Onset   Heart disease Father    Hyperlipidemia Father    Hypertension Father    Diabetes  Father     Current Outpatient Medications:    albuterol  (VENTOLIN  HFA) 108 (90 Base) MCG/ACT inhaler, Inhale 2 puffs into the lungs every 6 (six) hours as needed for wheezing or shortness of breath. Use 15 minutes prior to exercise, Disp: 8 g, Rfl: 0   amLODipine  (NORVASC ) 5 MG tablet, TAKE 1 TABLET BY MOUTH DAILY FOR FOR HIGH BLOOD PRESSURE, Disp: 90 tablet, Rfl: 3   atomoxetine  (STRATTERA ) 80 MG capsule, Take 1 capsule (80 mg total) by mouth daily., Disp: 90 capsule, Rfl: 3   fluticasone  (FLONASE ) 50 MCG/ACT nasal spray, Place 1 spray into both nostrils daily., Disp: 16 g, Rfl: 1   hydrOXYzine  (ATARAX ) 50 MG tablet, Take 1 tablet (50 mg total) by mouth at bedtime., Disp: 90 tablet, Rfl: 3   norethindrone-ethinyl estradiol 1/35 (NORTREL  1/35, 28,) tablet, TAKE 1 TABLET BY MOUTH DAILY ACTIVE PILLS ONLY NO PLACEBOS, Disp: 84 tablet, Rfl: 4   PARoxetine  (PAXIL ) 40 MG tablet, Take 1 tablet (40 mg total) by mouth every morning., Disp: 90 tablet, Rfl: 3   TRILEPTAL  300 MG tablet, Take 3 tablets (900 mg total) by mouth 2 (two) times daily. BRAND NAME MEDICALLY NECESSARY, Disp: 180 tablet, Rfl: 12   [START ON 03/08/2024] Methylphenidate  HCl ER 54 MG TB24, Take 1 tablet by mouth every morning., Disp: 30 tablet, Rfl: 0   [START ON 04/07/2024] Methylphenidate  HCl ER 54 MG TB24, Take 1 tablet by mouth in the morning., Disp: 30 tablet, Rfl: 0   [START ON 05/07/2024] Methylphenidate  HCl ER 54 MG TB24, Take 1 tablet by mouth in the morning., Disp: 30 tablet, Rfl: 0  No Known Allergies   ROS: Review of Systems Pertinent items noted in HPI and remainder of comprehensive ROS otherwise negative.    Physical exam BP 131/87   Pulse (!) 106   Temp 97.9 F (36.6 C)   Ht 5' 6 (1.676 m)   Wt 210 lb (95.3 kg)   SpO2 96%   BMI 33.89 kg/m  General appearance: alert, cooperative, appears stated age, no distress, and moderately obese Head: Normocephalic, without obvious abnormality, atraumatic Eyes: negative  findings: lids and lashes normal, conjunctivae and sclerae normal, corneas clear, pupils equal, round, reactive to light and accomodation, and wears glasses Ears: normal TM's and external ear canals both ears Nose: Nares normal. Septum midline. Mucosa normal. No drainage or sinus tenderness. Throat: lips, mucosa, and tongue normal; teeth and gums normal Neck: no adenopathy, supple, symmetrical, trachea midline, and thyroid  not enlarged, symmetric, no tenderness/mass/nodules Back: symmetric, no curvature. ROM normal. No CVA tenderness. Lungs: clear to auscultation bilaterally Heart: regular rate and rhythm, S1, S2 normal, no murmur, click, rub or gallop Abdomen: soft, non-tender; bowel sounds normal; no masses,  no organomegaly Extremities: extremities normal, atraumatic, no cyanosis or edema Pulses: 2+ and symmetric Skin: Skin color, texture, turgor normal. No rashes or lesions Lymph nodes: Cervical, supraclavicular, and axillary nodes normal. Neurologic: Grossly normal; follows commands, cognitively younger than actual age  02/25/2024    3:43 PM 03/14/2023   10:22 AM 02/28/2022    1:43 PM  Depression screen PHQ 2/9  Decreased Interest 0 3 3  Down, Depressed, Hopeless 0 0 0  PHQ - 2 Score 0 3 3  Altered sleeping 1 1 0  Tired, decreased energy 1 1 1   Change in appetite 3 3 1   Feeling bad or failure about yourself  0 0 0  Trouble concentrating 1 1 1   Moving slowly or fidgety/restless 1 0 2  Suicidal thoughts 0 0 0  PHQ-9 Score 7 9 8   Difficult doing work/chores Somewhat difficult Somewhat difficult Somewhat difficult      02/25/2024    3:43 PM 03/14/2023   10:22 AM 02/28/2022    1:43 PM 08/01/2021    3:11 PM  GAD 7 : Generalized Anxiety Score  Nervous, Anxious, on Edge 1 1 1  0  Control/stop worrying 1 1 1  0  Worry too much - different things 1 1 1  0  Trouble relaxing 1 1 1  0  Restless 1 1 1  0  Easily annoyed or irritable 1 1 1  0  Afraid - awful might happen 1 1 1  0  Total GAD  7 Score 7 7 7  0  Anxiety Difficulty Somewhat difficult Somewhat difficult Somewhat difficult Not difficult at all     Assessment/ Plan: Laymon Buffalo here for annual physical exam.   Annual physical exam  Attention deficit hyperactivity disorder (ADHD), combined type - Plan: CMP14+EGFR, ToxASSURE Select 13 (MW), Urine, Methylphenidate  HCl ER 54 MG TB24, Methylphenidate  HCl ER 54 MG TB24, Methylphenidate  HCl ER 54 MG TB24  Generalized anxiety disorder - Plan: CMP14+EGFR, CBC with Differential  Intellectual disability - Plan: CMP14+EGFR, CBC with Differential  Essential hypertension - Plan: CMP14+EGFR  Prediabetes - Plan: CMP14+EGFR, Bayer DCA Hb A1c Waived  Obesity (BMI 30.0-34.9) - Plan: CMP14+EGFR, Lipid Panel, VITAMIN D  25 Hydroxy (Vit-D Deficiency, Fractures)  Assessment and Plan    Adult Wellness Visit Routine wellness visit with sleep disturbances noted. Discussed weight management and dietary habits. - Perform blood draw for routine labs. - Monitor sleep patterns for signs of sleep apnea. - Encourage healthy snacking options and reduction of sugary foods. - Promote physical activity, including swimming and exercise. - Encourage reduction of soda intake and increase water consumption. - Flu shot per mom  Essential hypertension, controlled here. Hypertension management discussed. Home readings higher than office, possibly due to technique or activity. Medication adherence noted. - Ensure proper technique for home blood pressure monitoring. - Encourage consistent medication adherence, possibly shifting to evening dosing. - Limit dietary salt intake. - Re-evaluate blood pressure readings before considering medication adjustment.  Obesity, class 1 Class 1 obesity with recent weight loss efforts. Challenges with dietary habits noted. - Encourage healthier dietary choices, focusing on reducing sweets and increasing fruits and vegetables. - Consider eval for OSA  - Discussed  GLP/ GIP with mom but father not amenable at this time.      The Narcotic Database has been reviewed.  There were no red flags.  UDS and CSC updated today  Counseled on healthy lifestyle choices, including diet (rich in fruits, vegetables and lean meats and low in salt and simple carbohydrates) and exercise (at least 30 minutes of moderate physical activity daily).  Patient to follow up 52m for ADHD virtually.  Earnesteen Birnie M. Jolinda, DO

## 2024-02-25 NOTE — Patient Instructions (Addendum)
 Please watch for signs of sleep apnea (snoring, coughing in sleep, pauses in sleep).  We may need to consider evaluation for obstructive sleep apnea, which can be done through a home sleep test. Please consider NOT purchasing/ having high calorie snacks in the home (snack cakes, cookies, candies, sodas).  She has gained just under 10 lbs since last year.

## 2024-02-26 ENCOUNTER — Ambulatory Visit: Payer: Self-pay | Admitting: Family Medicine

## 2024-02-26 LAB — CBC WITH DIFFERENTIAL/PLATELET
Basophils Absolute: 0.1 x10E3/uL (ref 0.0–0.2)
Basos: 1 %
EOS (ABSOLUTE): 0.1 x10E3/uL (ref 0.0–0.4)
Eos: 1 %
Hematocrit: 40.9 % (ref 34.0–46.6)
Hemoglobin: 13.6 g/dL (ref 11.1–15.9)
Immature Grans (Abs): 0 x10E3/uL (ref 0.0–0.1)
Immature Granulocytes: 0 %
Lymphocytes Absolute: 2.8 x10E3/uL (ref 0.7–3.1)
Lymphs: 31 %
MCH: 29.4 pg (ref 26.6–33.0)
MCHC: 33.3 g/dL (ref 31.5–35.7)
MCV: 88 fL (ref 79–97)
Monocytes Absolute: 0.6 x10E3/uL (ref 0.1–0.9)
Monocytes: 7 %
Neutrophils Absolute: 5.5 x10E3/uL (ref 1.4–7.0)
Neutrophils: 60 %
Platelets: 370 x10E3/uL (ref 150–450)
RBC: 4.63 x10E6/uL (ref 3.77–5.28)
RDW: 12.6 % (ref 11.7–15.4)
WBC: 9 x10E3/uL (ref 3.4–10.8)

## 2024-02-26 LAB — CMP14+EGFR
ALT: 20 IU/L (ref 0–32)
AST: 17 IU/L (ref 0–40)
Albumin: 4.3 g/dL (ref 3.9–4.9)
Alkaline Phosphatase: 90 IU/L (ref 44–121)
BUN/Creatinine Ratio: 29 — ABNORMAL HIGH (ref 9–23)
BUN: 22 mg/dL — ABNORMAL HIGH (ref 6–20)
Bilirubin Total: <0.2 mg/dL (ref 0.0–1.2)
CO2: 20 mmol/L (ref 20–29)
Calcium: 9.7 mg/dL (ref 8.7–10.2)
Chloride: 105 mmol/L (ref 96–106)
Creatinine, Ser: 0.77 mg/dL (ref 0.57–1.00)
Globulin, Total: 2.6 g/dL (ref 1.5–4.5)
Glucose: 138 mg/dL — ABNORMAL HIGH (ref 70–99)
Potassium: 4.5 mmol/L (ref 3.5–5.2)
Sodium: 143 mmol/L (ref 134–144)
Total Protein: 6.9 g/dL (ref 6.0–8.5)
eGFR: 104 mL/min/1.73 (ref >59)

## 2024-02-26 LAB — LIPID PANEL
Chol/HDL Ratio: 3.8 ratio (ref 0.0–4.4)
Cholesterol, Total: 203 mg/dL — ABNORMAL HIGH (ref 100–199)
HDL: 53 mg/dL (ref >39)
LDL Chol Calc (NIH): 127 mg/dL — ABNORMAL HIGH (ref 0–99)
Triglycerides: 130 mg/dL (ref 0–149)
VLDL Cholesterol Cal: 23 mg/dL (ref 5–40)

## 2024-02-26 LAB — VITAMIN D 25 HYDROXY (VIT D DEFICIENCY, FRACTURES): Vit D, 25-Hydroxy: 55.4 ng/mL (ref 30.0–100.0)

## 2024-02-28 LAB — TOXASSURE SELECT 13 (MW), URINE

## 2024-04-28 ENCOUNTER — Ambulatory Visit: Payer: Self-pay

## 2024-04-28 NOTE — Telephone Encounter (Signed)
 FYI Only or Action Required?: FYI only for provider: appointment scheduled on 11/11.  Patient was last seen in primary care on 02/25/2024 by Jolinda Norene HERO, DO.  Called Nurse Triage reporting Ankle Pain.  Symptoms began today.  Interventions attempted: Rest, hydration, or home remedies.  Symptoms are: unchanged.  Triage Disposition: See PCP When Office is Open (Within 3 Days)  Patient/caregiver understands and will follow disposition?: Yes       Copied from CRM [id number]. Topic: Clinical - Red Word Triage >> Apr 28, 2024  3:06 PM Edsel HERO wrote: Ankle pain Reason for Disposition  [1] MODERATE pain (e.g., interferes with normal activities, limping) AND [2] present > 3 days  Answer Assessment - Initial Assessment Questions 1. ONSET: When did the pain start?      Today   2. LOCATION: Where is the pain located?      Right   3. PAIN: How bad is the pain?  (Scale 1-10; or mild, moderate, severe)     Unsure, patient has a hard time communicating, she is able to bear weight   4. WORK OR EXERCISE: Has there been any recent work or exercise that involved this part of the body?      No   5. CAUSE: What do you think is causing the ankle pain?     Patient twisted ankle today, while stepping out of door  6. OTHER SYMPTOMS: Do you have any other symptoms? (e.g., calf pain, rash, fever, swelling)     Mild swelling noted   7. PREGNANCY: Is there any chance you are pregnant? When was your last menstrual period?  No    Patients father called in to report patient twisting of her ankle today while stepping down from a step. She has not taken anything for the pain as of now, father was advised he may give her something OTC for the pain. He is icing the ankle, and she is able to bear weight.  Appointment scheduled for evaluation. Patient's father agrees with plan of care, and will call back if anything changes, or if symptoms worsen.  Protocols used: Ankle  Pain-A-AH

## 2024-04-28 NOTE — Telephone Encounter (Signed)
 Appt made.

## 2024-04-29 ENCOUNTER — Ambulatory Visit: Payer: MEDICAID | Admitting: Family Medicine

## 2024-04-29 ENCOUNTER — Encounter: Payer: Self-pay | Admitting: Family Medicine

## 2024-04-29 ENCOUNTER — Ambulatory Visit: Payer: MEDICAID

## 2024-04-29 VITALS — BP 132/85 | HR 125 | Temp 97.9°F | Ht 66.0 in | Wt 212.0 lb

## 2024-04-29 DIAGNOSIS — F79 Unspecified intellectual disabilities: Secondary | ICD-10-CM

## 2024-04-29 DIAGNOSIS — F422 Mixed obsessional thoughts and acts: Secondary | ICD-10-CM | POA: Diagnosis not present

## 2024-04-29 DIAGNOSIS — S9031XA Contusion of right foot, initial encounter: Secondary | ICD-10-CM

## 2024-04-29 DIAGNOSIS — S93401A Sprain of unspecified ligament of right ankle, initial encounter: Secondary | ICD-10-CM

## 2024-04-29 NOTE — Progress Notes (Signed)
 Subjective:  Patient ID: Tiffany Bates, female    DOB: 1991-05-25  Age: 33 y.o. MRN: [id number]  CC: Ankle Injury (Monday fell down a step and twisted right ankle. Ankle swollen and painful. Some discoloration. Hurts all throughout ankle. Bruise and pain going down into foot as well. Elevating and icing.)   HPI  Discussed the use of AI scribe software for clinical note transcription with the patient, who gave verbal consent to proceed.  History of Present Illness [name] is a 33 year old female who presents with right ankle pain after twisting it.  She twisted her right ankle yesterday around noon while stepping out onto the porch. The pain originates from the medial malleolus, extends down to the heel, and up to the inside of the arch and the metatarsophalangeal joint.  She can wiggle her toes but experiences pain when moving the ankle. The pain is exacerbated by touch, particularly on the inside and outside of the foot, extending to the base of the toe. She has been wearing a brace on the ankle, which she removed for examination.  She has been able to walk, although it is painful, and has been using a wheelchair to avoid putting weight on the ankle. She is concerned about her ability to participate in an upcoming [place], [place], scheduled for the [place]nd of this month.  No pain upon tapping the area but significant pain when the area is touched.          04/29/2024    9:31 AM 02/25/2024    3:43 PM 03/14/2023   10:22 AM  Depression screen PHQ 2/9  Decreased Interest 0 0 3  Down, Depressed, Hopeless 0 0 0  PHQ - 2 Score 0 0 3  Altered sleeping 1 1 1   Tired, decreased energy 1 1 1   Change in appetite 1 3 3   Feeling bad or failure about yourself  0 0 0  Trouble concentrating 1 1 1   Moving slowly or fidgety/restless 0 1 0  Suicidal thoughts 0 0 0  PHQ-9 Score 4 7  9    Difficult doing work/chores  Somewhat difficult Somewhat difficult     Data saved  with a previous flowsheet row definition    History Karren has a past medical history of ADHD (attention deficit hyperactivity disorder), Hypertension, Intellectual disability, and Tourette syndrome.   She has a past surgical history that includes Tonsillectomy and adenoidectomy.   Her family history includes Diabetes in her father; Heart disease in her father; Hyperlipidemia in her father; Hypertension in her father.She reports that she has never smoked. She has never used smokeless tobacco. She reports that she does not drink alcohol and does not use drugs.    ROS Review of Systems  Constitutional: Negative.   HENT: Negative.    Eyes:  Negative for visual disturbance.  Respiratory:  Negative for shortness of breath.   Cardiovascular:  Negative for chest pain.  Gastrointestinal:  Negative for abdominal pain.  Musculoskeletal:  Negative for arthralgias.    Objective:  BP 132/85   Pulse (!) 125   Temp 97.9 F (36.6 C)   Ht 5' 6 (1.676 m)   Wt 212 lb (96.2 kg)   SpO2 97%   BMI 34.22 kg/m   BP Readings from Last 3 Encounters:  04/29/24 132/85  02/25/24 131/87  11/26/23 (!) 129/96    Wt Readings from Last 3 Encounters:  04/29/24 212 lb (96.2 kg)  02/25/24 210 lb (95.3 kg)  08/29/23 199 lb (90.3 kg)     Physical Exam Physical Exam GENERAL: Alert, cooperative, well developed, no acute distress. HEENT: Normocephalic, normal oropharynx, moist mucous membranes. CHEST: Clear to auscultation bilaterally, no wheezes, rhonchi, or crackles. CARDIOVASCULAR: Normal heart rate and rhythm, S1 and S2 normal without murmurs. ABDOMEN: Soft, non-tender, non-distended, without organomegaly, normal bowel sounds. EXTREMITIES: Significant swelling and tenderness on right ankle, normal pulse on right ankle, no cyanosis. NEUROLOGICAL: Cranial nerves grossly intact, moves all extremities without gross motor or sensory deficit.  XR - no fracture noted  Assessment & Plan:  Contusion  of right foot, initial encounter -     DG Ankle Complete Right; Future -     DG Foot Complete Right; Future  Sprain of right ankle, unspecified ligament, initial encounter  Intellectual disability  Mixed obsessional thoughts and acts    Assessment and Plan Assessment & Plan Right foot and ankle contusion   She has an acute right foot and ankle contusion from an inversion injury while stepping onto a porch. Pain is localized from the medial malleolus to the heel and extends to the first metatarsophalangeal joint, with swelling on both the medial and lateral aspects of the foot. A possible fracture is considered, requiring imaging for further evaluation. Ordered a right foot and ankle x-ray to assess for fracture. Arranged wheelchair transport to minimize weight-bearing. Will consider upgrading the brace based on x-ray findings.       Follow-up: Return in about 2 weeks (around 05/13/2024).  Butler Der, M.D.

## 2024-05-01 ENCOUNTER — Ambulatory Visit: Payer: Self-pay | Admitting: Family Medicine

## 2024-05-01 ENCOUNTER — Encounter: Payer: Self-pay | Admitting: Family Medicine

## 2024-05-12 ENCOUNTER — Encounter: Payer: Self-pay | Admitting: Family Medicine

## 2024-05-13 ENCOUNTER — Other Ambulatory Visit (HOSPITAL_BASED_OUTPATIENT_CLINIC_OR_DEPARTMENT_OTHER): Payer: Self-pay

## 2024-05-13 ENCOUNTER — Telehealth: Payer: Self-pay | Admitting: Pharmacy Technician

## 2024-05-13 NOTE — Telephone Encounter (Signed)
 Pharmacy Patient Advocate Encounter   Received notification from Patient Advice Request messages that prior authorization for Trileptal  300MG  tablets  is required/requested.   Insurance verification completed.   The patient is insured through [employer].   Per test claim: PA required; PA started via CoverMyMeds. KEY [id number] . Waiting for clinical questions to populate.

## 2024-05-13 NOTE — Telephone Encounter (Signed)
OPS

## 2024-05-13 NOTE — Telephone Encounter (Signed)
 PA request has been Started. New Encounter has been or will be created for follow up. For additional info see Pharmacy Prior Auth telephone encounter from 05/13/24.

## 2024-05-14 ENCOUNTER — Other Ambulatory Visit (HOSPITAL_COMMUNITY): Payer: Self-pay

## 2024-05-14 NOTE — Telephone Encounter (Signed)
 Pharmacy Patient Advocate Encounter  Received notification from [facility] that Prior Authorization for Trileptal  300MG  tablets has been APPROVED from 05/13/24 to 05/13/25   PA #/Case ID/Reference #: [id number]

## 2024-06-16 ENCOUNTER — Encounter: Payer: Self-pay | Admitting: *Deleted

## 2024-06-17 ENCOUNTER — Telehealth: Payer: MEDICAID | Admitting: Family Medicine

## 2024-06-17 ENCOUNTER — Encounter: Payer: Self-pay | Admitting: Family Medicine

## 2024-06-17 DIAGNOSIS — F902 Attention-deficit hyperactivity disorder, combined type: Secondary | ICD-10-CM

## 2024-06-17 MED ORDER — METHYLPHENIDATE HCL ER 54 MG PO TB24: 1.0000 | ORAL_TABLET | Freq: Every morning | ORAL | 0 refills | Status: AC

## 2024-06-17 NOTE — Progress Notes (Signed)
 MyChart Video visit  Subjective: CC: ADHD meds PCP: Jolinda Norene HERO, DO YEP:Tiffany Bates is a 33 y.o. female. Patient provides verbal consent for consult held via video.  Due to COVID-19 pandemic this visit was conducted virtually. This visit type was conducted due to national recommendations for restrictions regarding the COVID-19 Pandemic (e.g. social distancing, sheltering in place) in an effort to limit this patient's exposure and mitigate transmission in our community. All issues noted in this document were discussed and addressed.  A physical exam was not performed with this format.   Location of patient: home Location of provider: [facility] Others present for call: dad and cathy (caretaker during daytime)  ADHD stable with current medications.  Her parent will be going out of town a couple of days and needs the medication renewed as they did not realize that they would be out of town when she was due for her next checkup.  No concerns.  Appetite remains good.  Mood is stable.   ROS: Per HPI  Allergies[1] Past Medical History:  Diagnosis Date   ADHD (attention deficit hyperactivity disorder)    Hypertension    Intellectual disability    Tourette syndrome    Current Medications[2]  Gen: well appearing female, NAD Psych: Mood stable, speech normal, affect appropriate.  Does not appear to be responding to internal stimuli  Assessment/ Plan: 33 y.o. female   Attention deficit hyperactivity disorder (ADHD), combined type - Plan: Methylphenidate  HCl ER 54 MG TB24, Methylphenidate  HCl ER 54 MG TB24, Methylphenidate  HCl ER 54 MG TB24 ADHD stable.  Medications renewed.  Up-to-date on UDS and CSC and the national narcotic database reviewed.  Follow-up virtual visit has been scheduled for March 9 and we will contact her during lunch.  This was discussed with Nathanel who is aware of appointment and will reach out to us  if there are any concerns prior to that time.  Start time: 12:16pm  text #1 sent, 12:18pm End time: 12:26pm  Total time spent on patient care (including video visit/ documentation): 10 minutes  Verl Kitson M Martel Galvan, DO [facility] [phone]      [1] No Known Allergies [2]  Current Outpatient Medications:    albuterol  (VENTOLIN  HFA) 108 (90 Base) MCG/ACT inhaler, Inhale 2 puffs into the lungs every 6 (six) hours as needed for wheezing or shortness of breath. Use 15 minutes prior to exercise, Disp: 8 g, Rfl: 0   amLODipine  (NORVASC ) 5 MG tablet, TAKE 1 TABLET BY MOUTH DAILY FOR FOR HIGH BLOOD PRESSURE, Disp: 90 tablet, Rfl: 3   atomoxetine  (STRATTERA ) 80 MG capsule, Take 1 capsule (80 mg total) by mouth daily., Disp: 90 capsule, Rfl: 3   fluticasone  (FLONASE ) 50 MCG/ACT nasal spray, Place 1 spray into both nostrils daily., Disp: 16 g, Rfl: 1   hydrOXYzine  (ATARAX ) 50 MG tablet, Take 1 tablet (50 mg total) by mouth at bedtime., Disp: 90 tablet, Rfl: 3   Methylphenidate  HCl ER 54 MG TB24, Take 1 tablet by mouth every morning., Disp: 30 tablet, Rfl: 0   Methylphenidate  HCl ER 54 MG TB24, Take 1 tablet by mouth in the morning., Disp: 30 tablet, Rfl: 0   Methylphenidate  HCl ER 54 MG TB24, Take 1 tablet by mouth in the morning., Disp: 30 tablet, Rfl: 0   norethindrone-ethinyl estradiol 1/35 (NORTREL  1/35, 28,) tablet, TAKE 1 TABLET BY MOUTH DAILY ACTIVE PILLS ONLY NO PLACEBOS, Disp: 84 tablet, Rfl: 4   PARoxetine  (PAXIL ) 40 MG tablet, Take 1 tablet (40  mg total) by mouth every morning., Disp: 90 tablet, Rfl: 3   TRILEPTAL  300 MG tablet, Take 3 tablets (900 mg total) by mouth 2 (two) times daily. BRAND NAME MEDICALLY NECESSARY, Disp: 180 tablet, Rfl: 12

## 2024-08-25 ENCOUNTER — Ambulatory Visit: Payer: MEDICAID | Admitting: Family Medicine
